# Patient Record
Sex: Female | Born: 1935 | Race: White | Hispanic: No | Smoking: Never smoker
Health system: Southern US, Community
[De-identification: ages and names within clinical notes are randomized; demographics above are authoritative.]

## PROBLEM LIST (undated history)

## (undated) DIAGNOSIS — I4891 Unspecified atrial fibrillation: Secondary | ICD-10-CM

## (undated) DIAGNOSIS — F329 Major depressive disorder, single episode, unspecified: Secondary | ICD-10-CM

## (undated) DIAGNOSIS — I513 Intracardiac thrombosis, not elsewhere classified: Secondary | ICD-10-CM

## (undated) DIAGNOSIS — F32A Depression, unspecified: Secondary | ICD-10-CM

## (undated) DIAGNOSIS — I5022 Chronic systolic (congestive) heart failure: Secondary | ICD-10-CM

## (undated) DIAGNOSIS — I10 Essential (primary) hypertension: Secondary | ICD-10-CM

## (undated) DIAGNOSIS — J40 Bronchitis, not specified as acute or chronic: Secondary | ICD-10-CM

## (undated) DIAGNOSIS — I5042 Chronic combined systolic (congestive) and diastolic (congestive) heart failure: Secondary | ICD-10-CM

## (undated) HISTORY — DX: Major depressive disorder, single episode, unspecified: F32.9

## (undated) HISTORY — DX: Depression, unspecified: F32.A

## (undated) HISTORY — DX: Essential (primary) hypertension: I10

## (undated) HISTORY — PX: SHOULDER SURGERY: SHX246

## (undated) HISTORY — DX: Unspecified atrial fibrillation: I48.91

## (undated) HISTORY — DX: Chronic combined systolic (congestive) and diastolic (congestive) heart failure: I50.42

---

## 2008-02-26 ENCOUNTER — Encounter: Payer: Self-pay | Admitting: Orthopedic Surgery

## 2008-10-02 ENCOUNTER — Ambulatory Visit: Payer: Self-pay | Admitting: Orthopedic Surgery

## 2008-10-02 DIAGNOSIS — M25519 Pain in unspecified shoulder: Secondary | ICD-10-CM | POA: Insufficient documentation

## 2008-10-02 DIAGNOSIS — M7580 Other shoulder lesions, unspecified shoulder: Secondary | ICD-10-CM

## 2008-11-17 ENCOUNTER — Encounter: Payer: Self-pay | Admitting: Orthopedic Surgery

## 2009-06-13 ENCOUNTER — Encounter: Admission: RE | Admit: 2009-06-13 | Discharge: 2009-06-13 | Payer: Self-pay | Admitting: Sports Medicine

## 2010-05-14 ENCOUNTER — Encounter: Payer: Self-pay | Admitting: Orthopedic Surgery

## 2010-09-24 NOTE — Letter (Signed)
Summary: Medical record request [employer]  Medical record request [employer]   Imported By: Cammie Sickle 06/15/2010 19:23:14  _____________________________________________________________________  External Attachment:    Type:   Image     Comment:   External Document

## 2013-02-23 ENCOUNTER — Telehealth: Payer: Self-pay | Admitting: Family Medicine

## 2013-03-07 ENCOUNTER — Other Ambulatory Visit: Payer: Self-pay | Admitting: *Deleted

## 2013-03-07 DIAGNOSIS — I4891 Unspecified atrial fibrillation: Secondary | ICD-10-CM

## 2013-03-08 ENCOUNTER — Encounter: Payer: Self-pay | Admitting: Cardiovascular Disease

## 2013-03-28 ENCOUNTER — Ambulatory Visit (HOSPITAL_COMMUNITY)
Admission: RE | Admit: 2013-03-28 | Discharge: 2013-03-28 | Disposition: A | Discharge status: Home / Self Care | Payer: Medicare Other | Source: Ambulatory Visit | Attending: Cardiovascular Disease | Admitting: Cardiovascular Disease

## 2013-03-28 DIAGNOSIS — I4891 Unspecified atrial fibrillation: Secondary | ICD-10-CM | POA: Insufficient documentation

## 2013-03-28 NOTE — Progress Notes (Signed)
2D Echo Performed 03/28/2013    Jahzier Villalon, RCS  

## 2013-04-18 ENCOUNTER — Other Ambulatory Visit: Payer: Self-pay | Admitting: Cardiovascular Disease

## 2013-04-18 LAB — CBC WITH DIFFERENTIAL/PLATELET
Eosinophils Absolute: 0.2 10*3/uL (ref 0.0–0.7)
Eosinophils Relative: 3 % (ref 0–5)
Hemoglobin: 16.3 g/dL — ABNORMAL HIGH (ref 12.0–15.0)
Lymphocytes Relative: 34 % (ref 12–46)
Lymphs Abs: 2.4 10*3/uL (ref 0.7–4.0)
MCHC: 32.9 g/dL (ref 30.0–36.0)
MCV: 87.9 fL (ref 78.0–100.0)
Neutro Abs: 3.8 10*3/uL (ref 1.7–7.7)
Neutrophils Relative %: 54 % (ref 43–77)
Platelets: 266 10*3/uL (ref 150–400)
RBC: 5.63 MIL/uL — ABNORMAL HIGH (ref 3.87–5.11)
WBC: 7 10*3/uL (ref 4.0–10.5)

## 2013-04-18 LAB — TSH: TSH: 7 u[IU]/mL — ABNORMAL HIGH (ref 0.350–4.500)

## 2013-04-26 ENCOUNTER — Other Ambulatory Visit: Payer: Self-pay | Admitting: Cardiovascular Disease

## 2013-04-26 LAB — T4, FREE: Free T4: 1.37 ng/dL (ref 0.80–1.80)

## 2013-04-26 LAB — TSH: TSH: 7.739 u[IU]/mL — ABNORMAL HIGH (ref 0.350–4.500)

## 2013-05-17 ENCOUNTER — Emergency Department (HOSPITAL_COMMUNITY): Payer: Medicare Other

## 2013-05-17 ENCOUNTER — Encounter (HOSPITAL_COMMUNITY): Payer: Self-pay

## 2013-05-17 ENCOUNTER — Emergency Department (HOSPITAL_COMMUNITY)
Admission: EM | Admit: 2013-05-17 | Discharge: 2013-05-17 | Disposition: A | Discharge status: Home / Self Care | Payer: Medicare Other | Attending: Emergency Medicine | Admitting: Emergency Medicine

## 2013-05-17 DIAGNOSIS — S0083XA Contusion of other part of head, initial encounter: Secondary | ICD-10-CM

## 2013-05-17 DIAGNOSIS — S0003XA Contusion of scalp, initial encounter: Secondary | ICD-10-CM | POA: Insufficient documentation

## 2013-05-17 DIAGNOSIS — E876 Hypokalemia: Secondary | ICD-10-CM | POA: Insufficient documentation

## 2013-05-17 DIAGNOSIS — IMO0002 Reserved for concepts with insufficient information to code with codable children: Secondary | ICD-10-CM | POA: Insufficient documentation

## 2013-05-17 DIAGNOSIS — W1809XA Striking against other object with subsequent fall, initial encounter: Secondary | ICD-10-CM | POA: Insufficient documentation

## 2013-05-17 DIAGNOSIS — S0990XA Unspecified injury of head, initial encounter: Secondary | ICD-10-CM | POA: Insufficient documentation

## 2013-05-17 DIAGNOSIS — I10 Essential (primary) hypertension: Secondary | ICD-10-CM | POA: Insufficient documentation

## 2013-05-17 DIAGNOSIS — E860 Dehydration: Secondary | ICD-10-CM

## 2013-05-17 DIAGNOSIS — T07XXXA Unspecified multiple injuries, initial encounter: Secondary | ICD-10-CM

## 2013-05-17 DIAGNOSIS — Y929 Unspecified place or not applicable: Secondary | ICD-10-CM | POA: Insufficient documentation

## 2013-05-17 DIAGNOSIS — Y939 Activity, unspecified: Secondary | ICD-10-CM | POA: Insufficient documentation

## 2013-05-17 DIAGNOSIS — W19XXXA Unspecified fall, initial encounter: Secondary | ICD-10-CM

## 2013-05-17 DIAGNOSIS — N390 Urinary tract infection, site not specified: Secondary | ICD-10-CM | POA: Insufficient documentation

## 2013-05-17 DIAGNOSIS — I4891 Unspecified atrial fibrillation: Secondary | ICD-10-CM | POA: Insufficient documentation

## 2013-05-17 LAB — URINALYSIS, ROUTINE W REFLEX MICROSCOPIC
Glucose, UA: NEGATIVE mg/dL
Hgb urine dipstick: NEGATIVE
Nitrite: POSITIVE — AB
Protein, ur: NEGATIVE mg/dL
Urobilinogen, UA: 2 mg/dL — ABNORMAL HIGH (ref 0.0–1.0)

## 2013-05-17 LAB — CBC
HCT: 48 % — ABNORMAL HIGH (ref 36.0–46.0)
MCH: 29.6 pg (ref 26.0–34.0)
MCHC: 32.3 g/dL (ref 30.0–36.0)
Platelets: 235 10*3/uL (ref 150–400)
RDW: 16.6 % — ABNORMAL HIGH (ref 11.5–15.5)
WBC: 14.1 10*3/uL — ABNORMAL HIGH (ref 4.0–10.5)

## 2013-05-17 LAB — URINE MICROSCOPIC-ADD ON

## 2013-05-17 LAB — BASIC METABOLIC PANEL
BUN: 24 mg/dL — ABNORMAL HIGH (ref 6–23)
CO2: 32 mEq/L (ref 19–32)
Chloride: 100 mEq/L (ref 96–112)
GFR calc non Af Amer: 45 mL/min — ABNORMAL LOW (ref >90)

## 2013-05-17 MED ORDER — NITROFURANTOIN MONOHYD MACRO 100 MG PO CAPS: 100.0000 mg | ORAL_CAPSULE | Freq: Two times a day (BID) | ORAL | Status: DC

## 2013-05-17 MED ORDER — POTASSIUM CHLORIDE CRYS ER 20 MEQ PO TBCR
40.0000 meq | EXTENDED_RELEASE_TABLET | Freq: Once | ORAL | Status: AC
  Administered 2013-05-17: 40 meq via ORAL
  Filled 2013-05-17: qty 2

## 2013-05-17 MED ORDER — SODIUM CHLORIDE 0.9 % IV BOLUS (SEPSIS)
1000.0000 mL | Freq: Once | INTRAVENOUS | Status: AC
  Administered 2013-05-17: 1000 mL via INTRAVENOUS

## 2013-05-17 MED ORDER — POTASSIUM CHLORIDE CRYS ER 20 MEQ PO TBCR: 20.0000 meq | EXTENDED_RELEASE_TABLET | Freq: Every day | ORAL | Status: DC

## 2013-05-17 NOTE — ED Notes (Signed)
Pt notified that urine sample is needed for UA, pt states that she cant void at this time

## 2013-05-17 NOTE — ED Notes (Signed)
Per EDP pt is to be discharged once liter of NS has infused.

## 2013-05-17 NOTE — ED Notes (Signed)
All abrasions, red areas cleaned with NS, pt tolerated well, small amount of blood noted to be coming from area to forehead. Bleeding controlled, pt denies any LOC, neck or back pain, last tetanus was <5 years ago, pt now states that she thinks that her foot may have become tangled on a rug in her bedroom causing her to fall.

## 2013-05-17 NOTE — ED Provider Notes (Signed)
5:02 PM  Discussed with patient's daughter her multiple diagnoses. They report because patient is well-appearing, no complaints, they would like to take the patient home. I feel like this is a completely reasonable plan. Discussed delayed head bleed return precautions given patient is on anticoagulation. Patient's daughter will be able to stay with the patient today and tonight. Will have him followup with her primary care physician to have her potassium rechecked in the next several days.  Layla Maw Lavette Yankovich, DO 05/17/13 [phone]

## 2013-05-17 NOTE — ED Notes (Signed)
Pt still unable to obtain urine sample,  

## 2013-05-17 NOTE — ED Notes (Signed)
Pt reports woke up this am and legs felt weak when she started walking.  Pt says fell and hit head on a chair.  Pt has laceration to left side of head, swelling, bruise, and laceration to forehead, and abrasion to r knee.  Pt on blood thinner.

## 2013-05-17 NOTE — ED Notes (Signed)
NS still infusing.  Pt is in no distress, eating meal that family brought

## 2013-05-17 NOTE — ED Notes (Addendum)
Pt states that she was bending over to place water for her dog in the floor when she fell hitting her head against a wooden chair, falling to the floor, pt has swelling, laceration noted to left forehead area, ?laceration to left side of head area, abrasion to right knee area, redness noted to bilateral knees area, abrasions noted to bilateral toes, bleeding controlled at present time, pt alert able to answer questions, states that she got up this am and that her legs have been feeling weak today. Recently being treated for bronchitis and sinus infection, last dose of antibiotics is to be taken today.

## 2013-05-17 NOTE — ED Notes (Signed)
Wounds cleaned and band aids applied

## 2013-05-17 NOTE — ED Provider Notes (Signed)
This chart was scribed for Layla Maw Akiba Melfi, DO by Dorothey Baseman, ED Scribe. This patient was seen in room APA10/APA10 and the patient's care was started at 12:56 PM.   TIME SEEN: 12:56 PM  CHIEF COMPLAINT: fall  HPI:  HPI Comments: Laurie Ramirez is a 79 y.o. female  With history of hypertension, atrial fibrillation on amiodarone who presents to the Emergency Department complaining of a fall that occurred this morning when she reports that her extremities felt weak. She reports hitting her head on a chair, but denies loss of consciousness. Patient reports a laceration to the left side of the forehead and head, abrasion to the right knee with associated ecchymosis and swelling. She was able to walk after her fall. She denies chest pain, shortness of breath, dizziness, weakness, numbness, or paresthesias, or focal weakness. Patient is on Xarelto.  Patient reports that her last tetanus vaccination was 4-5 years ago.    ROS: See HPI Constitutional: no fever  Eyes: no drainage  ENT: no runny nose   Cardiovascular:  no chest pain  Resp: no SOB  GI: no vomiting GU: no dysuria Integumentary: no rash  Allergy: no hives  Musculoskeletal: no leg swelling  Neurological: no slurred speech ROS otherwise negative  PAST MEDICAL HISTORY/PAST SURGICAL HISTORY:  Past Medical History  Diagnosis Date  . Hypertension   . Irregular heart rate     MEDICATIONS:  Prior to Admission medications   Not on File    ALLERGIES:  No Known Allergies  SOCIAL HISTORY:  History  Substance Use Topics  . Smoking status: Never Smoker   . Smokeless tobacco: Not on file  . Alcohol Use: No    FAMILY HISTORY: No family history on file.  EXAM:  Triage Vitals: BP 129/42  Pulse 97  Temp(Src) 98.5 F (36.9 C) (Oral)  Resp 20  Ht 5\' 4"  (1.626 m)  Wt 190 lb (86.183 kg)  BMI 32.6 kg/m2  SpO2 94%  CONSTITUTIONAL: Alert and oriented x 3 and responds appropriately to questions. Well-appearing; well-nourished;  GCS 15 HEAD: Normocephalic; large left-sided for head hematoma with associated superficial laceration that is hemostatic EYES: Conjunctivae clear, PERRL, EOMI ENT: normal nose; no rhinorrhea; moist mucous membranes; pharynx without lesions noted; no dental injury; no hemotypanum; no septal hematoma NECK: Supple, no meningismus, no LAD; no midline spinal tenderness, step-off or deformity CARD: RRR; S1 and S2 appreciated; no murmurs, no clicks, no rubs, no gallops RESP: Normal chest excursion without splinting or tachypnea; breath sounds clear and equal bilaterally; no wheezes, no rhonchi, no rales; chest wall stable, nontender to palpation ABD/GI: Normal bowel sounds; non-distended; soft, non-tender, no rebound, no guarding PELVIS:  stable, nontender to palpation BACK:  The back appears normal and is non-tender to palpation, there is no CVA tenderness; no midline spinal tenderness, step-off or deformity, patient has significant kyphosis EXT: Normal ROM in all joints; non-tender to palpation; no edema; normal capillary refill; no cyanosis    SKIN: Normal color for age and race; warm; hematoma to left forehead, 3 cm abrasion to left temporal area, abrasion to the right knee and bilateral dorsal toes NEURO: Moves all extremities equally, sensation to light touch intact diffusely, cranial nerves II through XII intact PSYCH: The patient's mood and manner are appropriate. Grooming and personal hygiene are appropriate.  MEDICAL DECISION MAKING: 12:58PM- Will order a CT of the head and neck, UA, and labs. Discussed treatment plan with patient at bedside and patient verbalized agreement.   4:29 PM  Pt is in her normal state of health and has no current complaints. She is hemodynamically stable. Her labs do show mild dehydration for which I've given 1 L of IV fluids and encouraged her to continue to drink plenty of fluids at home. She is also mildly hypokalemic which we will replace in the ED as well as at  home. Patient also has a urinary tract infection. We'll discharge with prescription for Macrobid. Urine culture pending. Head and cervical spine CTs reviewed and are unremarkable. No acute injury. Discussed with patient and family that I feel she is safe home and followup with her primary care physician. Given strict return precautions. They verbalize understanding and are comfortable with plan.   I personally performed the services described in this documentation, which was scribed in my presence. The recorded information has been reviewed and is accurate.    Layla Maw Gradie Butrick, DO 05/17/13 1630

## 2013-05-19 LAB — URINE CULTURE

## 2013-05-20 ENCOUNTER — Telehealth (HOSPITAL_COMMUNITY): Payer: Self-pay | Admitting: Emergency Medicine

## 2013-05-20 NOTE — ED Notes (Signed)
Post ED Visit - Positive Culture Follow-up  Culture report reviewed by antimicrobial stewardship pharmacist: []  Wes Dulaney, Pharm.D., BCPS [x]  Celedonio Miyamoto, Pharm.D., BCPS []  Georgina Pillion, [address].D., BCPS []  [place], [place].D., BCPS, AAHIVP []  Estella Husk, Pharm.D., BCPS, AAHIVP  Positive urine culture Treated with Macrobid, organism sensitive to the same and no further patient follow-up is required at this time.  [name] 05/20/2013, 1:49 PM

## 2013-06-18 ENCOUNTER — Emergency Department (HOSPITAL_COMMUNITY): Payer: Medicare Other

## 2013-06-18 ENCOUNTER — Encounter (HOSPITAL_COMMUNITY): Payer: Self-pay | Admitting: Emergency Medicine

## 2013-06-18 ENCOUNTER — Inpatient Hospital Stay (HOSPITAL_COMMUNITY)
Admission: EM | Admit: 2013-06-18 | Discharge: 2013-06-21 | DRG: 291 | Disposition: A | Discharge status: Home Health | Payer: Medicare Other | Attending: Internal Medicine | Admitting: Internal Medicine

## 2013-06-18 DIAGNOSIS — Z8249 Family history of ischemic heart disease and other diseases of the circulatory system: Secondary | ICD-10-CM

## 2013-06-18 DIAGNOSIS — I4891 Unspecified atrial fibrillation: Secondary | ICD-10-CM | POA: Diagnosis present

## 2013-06-18 DIAGNOSIS — E876 Hypokalemia: Secondary | ICD-10-CM | POA: Diagnosis present

## 2013-06-18 DIAGNOSIS — I5022 Chronic systolic (congestive) heart failure: Secondary | ICD-10-CM

## 2013-06-18 DIAGNOSIS — E669 Obesity, unspecified: Secondary | ICD-10-CM | POA: Diagnosis present

## 2013-06-18 DIAGNOSIS — I509 Heart failure, unspecified: Secondary | ICD-10-CM | POA: Diagnosis present

## 2013-06-18 DIAGNOSIS — Z6837 Body mass index (BMI) 37.0-37.9, adult: Secondary | ICD-10-CM

## 2013-06-18 DIAGNOSIS — J9600 Acute respiratory failure, unspecified whether with hypoxia or hypercapnia: Secondary | ICD-10-CM | POA: Diagnosis present

## 2013-06-18 DIAGNOSIS — I10 Essential (primary) hypertension: Secondary | ICD-10-CM | POA: Diagnosis present

## 2013-06-18 DIAGNOSIS — I5023 Acute on chronic systolic (congestive) heart failure: Principal | ICD-10-CM | POA: Diagnosis present

## 2013-06-18 HISTORY — DX: Chronic systolic (congestive) heart failure: I50.22

## 2013-06-18 HISTORY — DX: Bronchitis, not specified as acute or chronic: J40

## 2013-06-18 LAB — CBC WITH DIFFERENTIAL/PLATELET
Basophils Absolute: 0 10*3/uL (ref 0.0–0.1)
Basophils Relative: 0 % (ref 0–1)
Eosinophils Relative: 2 % (ref 0–5)
HCT: 47.8 % — ABNORMAL HIGH (ref 36.0–46.0)
Hemoglobin: 15.4 g/dL — ABNORMAL HIGH (ref 12.0–15.0)
Lymphocytes Relative: 30 % (ref 12–46)
MCHC: 32.2 g/dL (ref 30.0–36.0)
MCV: 94.3 fL (ref 78.0–100.0)
Monocytes Absolute: 0.5 10*3/uL (ref 0.1–1.0)
Monocytes Relative: 6 % (ref 3–12)
Neutro Abs: 4.8 10*3/uL (ref 1.7–7.7)
WBC: 7.9 10*3/uL (ref 4.0–10.5)

## 2013-06-18 LAB — COMPREHENSIVE METABOLIC PANEL
Albumin: 3.1 g/dL — ABNORMAL LOW (ref 3.5–5.2)
Alkaline Phosphatase: 61 U/L (ref 39–117)
BUN: 28 mg/dL — ABNORMAL HIGH (ref 6–23)
Chloride: 99 mEq/L (ref 96–112)
GFR calc Af Amer: 53 mL/min — ABNORMAL LOW (ref >90)
Glucose, Bld: 103 mg/dL — ABNORMAL HIGH (ref 70–99)
Potassium: 3.7 mEq/L (ref 3.5–5.1)
Sodium: 137 mEq/L (ref 135–145)
Total Bilirubin: 1.4 mg/dL — ABNORMAL HIGH (ref 0.3–1.2)

## 2013-06-18 LAB — TROPONIN I
Troponin I: <0.3 ng/mL (ref <0.30)
Troponin I: <0.3 ng/mL (ref <0.30)

## 2013-06-18 MED ORDER — INFLUENZA VAC SPLIT QUAD 0.5 ML IM SUSP
0.5000 mL | INTRAMUSCULAR | Status: AC
  Administered 2013-06-19: 0.5 mL via INTRAMUSCULAR
  Filled 2013-06-18: qty 0.5

## 2013-06-18 MED ORDER — HYDROCHLOROTHIAZIDE 12.5 MG PO CAPS
12.5000 mg | ORAL_CAPSULE | Freq: Two times a day (BID) | ORAL | Status: DC
  Administered 2013-06-18 – 2013-06-21 (×6): 12.5 mg via ORAL
  Filled 2013-06-18 (×6): qty 1

## 2013-06-18 MED ORDER — FUROSEMIDE 10 MG/ML IJ SOLN
40.0000 mg | Freq: Once | INTRAMUSCULAR | Status: AC
  Administered 2013-06-18: 40 mg via INTRAVENOUS
  Filled 2013-06-18: qty 4

## 2013-06-18 MED ORDER — ACETAMINOPHEN 325 MG PO TABS
650.0000 mg | ORAL_TABLET | ORAL | Status: DC | PRN
  Administered 2013-06-18: 650 mg via ORAL
  Filled 2013-06-18: qty 2

## 2013-06-18 MED ORDER — LISINOPRIL 10 MG PO TABS
20.0000 mg | ORAL_TABLET | Freq: Two times a day (BID) | ORAL | Status: DC
  Administered 2013-06-18 – 2013-06-21 (×6): 20 mg via ORAL
  Filled 2013-06-18 (×6): qty 2

## 2013-06-18 MED ORDER — AMIODARONE HCL 200 MG PO TABS
200.0000 mg | ORAL_TABLET | Freq: Every day | ORAL | Status: DC
  Administered 2013-06-19 – 2013-06-21 (×3): 200 mg via ORAL
  Filled 2013-06-18 (×3): qty 1

## 2013-06-18 MED ORDER — DULOXETINE HCL 30 MG PO CPEP
30.0000 mg | ORAL_CAPSULE | Freq: Every day | ORAL | Status: DC
  Administered 2013-06-18 – 2013-06-21 (×4): 30 mg via ORAL
  Filled 2013-06-18 (×4): qty 1

## 2013-06-18 MED ORDER — LISINOPRIL-HYDROCHLOROTHIAZIDE 20-12.5 MG PO TABS: 1.0000 | ORAL_TABLET | Freq: Two times a day (BID) | ORAL | Status: DC

## 2013-06-18 MED ORDER — SODIUM CHLORIDE 0.9 % IV SOLN: 250.0000 mL | INTRAVENOUS | Status: DC | PRN

## 2013-06-18 MED ORDER — SODIUM CHLORIDE 0.9 % IJ SOLN
3.0000 mL | Freq: Two times a day (BID) | INTRAMUSCULAR | Status: DC
  Administered 2013-06-18 – 2013-06-21 (×5): 3 mL via INTRAVENOUS

## 2013-06-18 MED ORDER — POTASSIUM CHLORIDE CRYS ER 20 MEQ PO TBCR
40.0000 meq | EXTENDED_RELEASE_TABLET | Freq: Every day | ORAL | Status: DC
  Administered 2013-06-18 – 2013-06-21 (×4): 40 meq via ORAL
  Filled 2013-06-18 (×4): qty 2

## 2013-06-18 MED ORDER — METOPROLOL TARTRATE 50 MG PO TABS
100.0000 mg | ORAL_TABLET | Freq: Two times a day (BID) | ORAL | Status: DC
  Administered 2013-06-18 – 2013-06-21 (×6): 100 mg via ORAL
  Filled 2013-06-18 (×6): qty 2

## 2013-06-18 MED ORDER — ALBUTEROL SULFATE (5 MG/ML) 0.5% IN NEBU
5.0000 mg | INHALATION_SOLUTION | Freq: Once | RESPIRATORY_TRACT | Status: AC
  Administered 2013-06-18: 5 mg via RESPIRATORY_TRACT
  Filled 2013-06-18: qty 1

## 2013-06-18 MED ORDER — IPRATROPIUM BROMIDE 0.02 % IN SOLN
0.5000 mg | Freq: Once | RESPIRATORY_TRACT | Status: AC
  Administered 2013-06-18: 0.5 mg via RESPIRATORY_TRACT
  Filled 2013-06-18: qty 2.5

## 2013-06-18 MED ORDER — METHYLPREDNISOLONE SODIUM SUCC 125 MG IJ SOLR
125.0000 mg | Freq: Once | INTRAMUSCULAR | Status: AC
  Administered 2013-06-18: 125 mg via INTRAVENOUS
  Filled 2013-06-18: qty 2

## 2013-06-18 MED ORDER — SODIUM CHLORIDE 0.9 % IJ SOLN
3.0000 mL | INTRAMUSCULAR | Status: DC | PRN
  Administered 2013-06-20: 3 mL via INTRAVENOUS

## 2013-06-18 MED ORDER — FUROSEMIDE 10 MG/ML IJ SOLN
40.0000 mg | Freq: Two times a day (BID) | INTRAMUSCULAR | Status: DC
  Administered 2013-06-18 – 2013-06-21 (×6): 40 mg via INTRAVENOUS
  Filled 2013-06-18 (×6): qty 4

## 2013-06-18 MED ORDER — ONDANSETRON HCL 4 MG/2ML IJ SOLN: 4.0000 mg | Freq: Four times a day (QID) | INTRAMUSCULAR | Status: DC | PRN

## 2013-06-18 MED ORDER — ASPIRIN EC 81 MG PO TBEC
81.0000 mg | DELAYED_RELEASE_TABLET | Freq: Every day | ORAL | Status: DC
  Administered 2013-06-18 – 2013-06-21 (×4): 81 mg via ORAL
  Filled 2013-06-18 (×4): qty 1

## 2013-06-18 MED ORDER — ENOXAPARIN SODIUM 40 MG/0.4ML ~~LOC~~ SOLN
40.0000 mg | SUBCUTANEOUS | Status: DC
  Administered 2013-06-18 – 2013-06-20 (×3): 40 mg via SUBCUTANEOUS
  Filled 2013-06-18 (×3): qty 0.4

## 2013-06-18 MED ORDER — OLOPATADINE HCL 0.1 % OP SOLN
1.0000 [drp] | Freq: Two times a day (BID) | OPHTHALMIC | Status: DC
  Administered 2013-06-19 – 2013-06-21 (×5): 1 [drp] via OPHTHALMIC
  Filled 2013-06-18: qty 5

## 2013-06-18 NOTE — ED Provider Notes (Signed)
CSN: [id number]     Arrival date & time 06/18/13  0917 History   This chart was scribed for Laurie Lennert, MD by Bennett Scrape, ED Scribe. This patient was seen in room APA07/APA07 and the patient's care was started at 9:37 AM.   Chief Complaint  Patient presents with  . Shortness of Breath    Patient is a 79 y.o. female presenting with shortness of breath. The history is provided by the patient. No language interpreter was used.  Shortness of Breath Severity:  Mild Onset quality:  Gradual Duration:  6 weeks Timing:  Constant Progression:  Worsening Chronicity:  New Context: URI   Relieved by:  Nothing Worsened by:  Exertion Associated symptoms: cough and wheezing   Associated symptoms: no abdominal pain, no chest pain, no headaches and no rash     HPI Comments: Laurie Ramirez is a 79 y.o. female who presents to the Emergency Department complaining of 5 to 6 weeks of SOB with associated wheezing and cough. The SOB increases with exertion and pt reports that the cough was productive of pale sputum up until last week. She denies yellow or green sputum. She states that she was seen by her PCP, diagnosed with bronchitis and has finished two round of antibiotics with no improvement. She denies any fevers. She has a h/o recently diagnosed irregular heart rate within the past two months.   [facility]P is [facility] [place], prior echocardiogram last year. Unsure of the results.   Past Medical History  Diagnosis Date  . Hypertension   . Irregular heart rate   . A-fib   . Bronchitis    Past Surgical History  Procedure Laterality Date  . Shoulder surgery     No family history on file. History  Substance Use Topics  . Smoking status: Never Smoker   . Smokeless tobacco: Not on file  . Alcohol Use: No   No OB history provided.  Review of Systems  Constitutional: Negative for appetite change and fatigue.  HENT: Negative for congestion, ear discharge and sinus pressure.    Eyes: Negative for discharge.  Respiratory: Positive for cough, shortness of breath and wheezing.   Cardiovascular: Negative for chest pain.  Gastrointestinal: Negative for abdominal pain and diarrhea.  Genitourinary: Negative for frequency and hematuria.  Musculoskeletal: Negative for back pain.  Skin: Negative for rash.  Neurological: Negative for seizures and headaches.  Psychiatric/Behavioral: Negative for hallucinations.    Allergies  Review of patient's allergies indicates no known allergies.  Home Medications   Current Outpatient Rx  Name  Route  Sig  Dispense  Refill  . amiodarone (PACERONE) 200 MG tablet   Oral   Take 200 mg by mouth daily.         Marland Kitchen azithromycin (ZITHROMAX) 250 MG tablet   Oral   Take 250-500 mg by mouth daily. Take 500mg  on day 1 and then take 250mg  on days 2-5.         Marland Kitchen diclofenac (VOLTAREN) 75 MG EC tablet   Oral   Take 75 mg by mouth 2 (two) times daily.         . DULoxetine (CYMBALTA) 30 MG capsule   Oral   Take 30 mg by mouth daily.         Marland Kitchen ibuprofen (ADVIL,MOTRIN) 200 MG tablet   Oral   Take 400 mg by mouth every 6 (six) hours as needed for pain.         . metoprolol (  LOPRESSOR) 100 MG tablet   Oral   Take 100 mg by mouth 2 (two) times daily.         . nitrofurantoin, macrocrystal-monohydrate, (MACROBID) 100 MG capsule   Oral   Take 1 capsule (100 mg total) by mouth 2 (two) times daily.   14 capsule   0   . potassium chloride SA (K-DUR,KLOR-CON) 20 MEQ tablet   Oral   Take 1 tablet (20 mEq total) by mouth daily.   6 tablet   0   . Rivaroxaban (XARELTO) 20 MG TABS tablet   Oral   Take 20 mg by mouth daily.          Triage Vitals: BP 143/85  Pulse 89  Temp(Src) 98.3 F (36.8 C) (Oral)  Resp 24  SpO2 96%  Physical Exam  Nursing note and vitals reviewed. Constitutional: She is oriented to person, place, and time. She appears well-developed and well-nourished.  HENT:  Head: Normocephalic and  atraumatic.  Eyes: Conjunctivae and EOM are normal. No scleral icterus.  Neck: Neck supple. No thyromegaly present.  Cardiovascular: Normal rate.  An irregular rhythm present. Exam reveals no gallop and no friction rub.   No murmur heard. Pulmonary/Chest: Effort normal. No stridor. She has wheezes (minimal wheezing bilaterally). She has no rales. She exhibits no tenderness.  Abdominal: She exhibits no distension. There is no tenderness. There is no rebound.  Musculoskeletal: Normal range of motion. She exhibits no edema.  Lymphadenopathy:    She has no cervical adenopathy.  Neurological: She is oriented to person, place, and time. She exhibits normal muscle tone. Coordination normal.  Skin: No rash noted. No erythema.  Psychiatric: She has a normal mood and affect. Her behavior is normal.    ED Course  Procedures (including critical care time)  Medications  ipratropium (ATROVENT) nebulizer solution 0.5 mg (not administered)  albuterol (PROVENTIL) (5 MG/ML) 0.5% nebulizer solution 5 mg (not administered)  methylPREDNISolone sodium succinate (SOLU-MEDROL) 125 mg/2 mL injection 125 mg (not administered)    DIAGNOSTIC STUDIES: Oxygen Saturation is 96% on room air, normal by my interpretation.    COORDINATION OF CARE: 9:39 AM-Discussed treatment plan which includes breathing treatment, CXR, CBC panel, CMP and troponin with pt at bedside and pt agreed to plan.   12:02 PM- Pt rechecked and wheezing has improved. Informed pt of CXR results showing fluid. Discussed admission with pt and pt is agreeable.  12:10 PM-Consult complete with Dr. Kerry Hough, Hospitalist. Patient case explained and discussed. Dr. Kerry Hough agrees to admit patient for further evaluation and treatment. Call ended at 12:11 PM  Labs Review Labs Reviewed  CBC WITH DIFFERENTIAL - Abnormal; Notable for the following:    Hemoglobin 15.4 (*)    HCT 47.8 (*)    RDW 16.0 (*)    All other components within normal limits  PRO B  NATRIURETIC PEPTIDE - Abnormal; Notable for the following:    Pro B Natriuretic peptide (BNP) 11470.0 (*)    All other components within normal limits  COMPREHENSIVE METABOLIC PANEL - Abnormal; Notable for the following:    Glucose, Bld 103 (*)    BUN 28 (*)    Creatinine, Ser 1.11 (*)    Albumin 3.1 (*)    Total Bilirubin 1.4 (*)    GFR calc non Af Amer 46 (*)    GFR calc Af Amer 53 (*)    All other components within normal limits  TROPONIN I   Imaging Review Dg Chest Portable 1 View  06/18/2013   CLINICAL DATA:  Shortness of breath  EXAM: PORTABLE CHEST - 1 VIEW  COMPARISON:  None.  FINDINGS: Mild to moderate cardiac enlargement. Mild vascular congestion. No evidence of pulmonary edema or pleural effusion. Significant glenohumeral degenerative change bilaterally.  IMPRESSION: Cardiomegaly with vascular congestion but no frank pulmonary edema.   Electronically Signed   By: Esperanza Heir M.D.   On: 06/18/2013 09:58    EKG Interpretation     Ventricular Rate:  96 PR Interval:    QRS Duration: 140 QT Interval:  358 QTC Calculation: 452 R Axis:   42 Text Interpretation:  Atrial fibrillation Right bundle branch block Abnormal ECG No previous ECGs available            MDM  No diagnosis found.   The chart was scribed for me under my direct supervision.  I personally performed the history, physical, and medical decision making and all procedures in the evaluation of this patient.Laurie Lennert, MD 06/18/13 [phone]

## 2013-06-18 NOTE — H&P (Signed)
[facility] History and Physical  Laurie Ramirez:[id number] DOB: May 22, 1932 DOA: 06/18/2013  Referring physician: Dr. Estell Harpin PCP: Colette Ribas, MD  Specialists:   Chief Complaint: shortness of breath  HPI: Laurie Ramirez is a 79 y.o. female who presents to the emergency room with progressive shortness of breath. She is having dyspnea for the past several weeks which is progressively gotten worse. She describes PND and orthopnea. She's also noticed increased ankle edema as well as a weight gain of 30 pounds in the past month. She has seen her primary care physician and has received 2 courses of antibiotics for possible upper respiratory tract infections. She has been coughing with yellow-colored sputum. She has not been febrile. She denies any chest pain or palpitations. She was evaluated in the emergency room where chest x-ray indicated congestive heart failure. Her BNP was markedly elevated. She recently had had an echocardiogram which showed ejection fraction of 45-50%. The patient will be admitted to the hospital for further treatment of congestive heart failure.  Review of Systems: Pertinent positives as per history of present illness, otherwise negative  Past Medical History  Diagnosis Date  . Hypertension   . Irregular heart rate   . A-fib   . Bronchitis    Past Surgical History  Procedure Laterality Date  . Shoulder surgery     Social History:  reports that she has never smoked. She does not have any smokeless tobacco history on file. She reports that she does not drink alcohol or use illicit drugs.   No Known Allergies  Family history:  Mothers side has coronary artery disease  Prior to Admission medications   Medication Sig Start Date End Date Taking? Authorizing Provider  amiodarone (PACERONE) 200 MG tablet Take 200 mg by mouth daily.   Yes Historical Provider, MD  aspirin EC 81 MG tablet Take 81 mg by mouth daily.   Yes Historical Provider, MD   diclofenac (VOLTAREN) 75 MG EC tablet Take 75 mg by mouth daily.    Yes Historical Provider, MD  DULoxetine (CYMBALTA) 30 MG capsule Take 30 mg by mouth daily.   Yes Historical Provider, MD  ibuprofen (ADVIL,MOTRIN) 200 MG tablet Take 400 mg by mouth every 6 (six) hours as needed for pain.   Yes Historical Provider, MD  lisinopril-hydrochlorothiazide (PRINZIDE,ZESTORETIC) 20-12.5 MG per tablet Take 1 tablet by mouth 2 (two) times daily.   Yes Historical Provider, MD  metoprolol (LOPRESSOR) 100 MG tablet Take 100 mg by mouth 2 (two) times daily.   Yes Historical Provider, MD  Olopatadine HCl (PATADAY) 0.2 % SOLN Apply 1 drop to eye daily as needed (allergies).    Historical Provider, MD   Physical Exam: Filed Vitals:   06/18/13 1304  BP: 144/89  Pulse: 102  Temp:   Resp:      General:  NAD  Eyes: Are equal, round, reactive to light  ENT: Mucous members are moist  Neck: Supple  Cardiovascular: S1, S2, irregular  Respiratory: Crackles at bases bilaterally  Abdomen: Soft, obese, positive bowel sounds  Skin: No rashes  Musculoskeletal: One to 2+ pitting edema bilaterally  Psychiatric: Normal affect, cooperative with exam  Neurologic: Grossly intact, nonfocal  Labs on Admission:  Basic Metabolic Panel:  Recent Labs Lab 06/18/13 1015  NA 137  K 3.7  CL 99  CO2 31  GLUCOSE 103*  BUN 28*  CREATININE 1.11*  CALCIUM 9.5   Liver Function Tests:  Recent Labs Lab 06/18/13 1015  AST 18  ALT 10  ALKPHOS 61  BILITOT 1.4*  PROT 6.4  ALBUMIN 3.1*   No results found for this basename: LIPASE, AMYLASE,  in the last 168 hours No results found for this basename: AMMONIA,  in the last 168 hours CBC:  Recent Labs Lab 06/18/13 0953  WBC 7.9  NEUTROABS 4.8  HGB 15.4*  HCT 47.8*  MCV 94.3  PLT 210   Cardiac Enzymes:  Recent Labs Lab 06/18/13 1015  TROPONINI <0.30    BNP (last 3 results)  Recent Labs  06/18/13 1015  PROBNP 11470.0*   CBG: No  results found for this basename: GLUCAP,  in the last 168 hours  Radiological Exams on Admission: Dg Chest Portable 1 View  06/18/2013   CLINICAL DATA:  Shortness of breath  EXAM: PORTABLE CHEST - 1 VIEW  COMPARISON:  None.  FINDINGS: Mild to moderate cardiac enlargement. Mild vascular congestion. No evidence of pulmonary edema or pleural effusion. Significant glenohumeral degenerative change bilaterally.  IMPRESSION: Cardiomegaly with vascular congestion but no frank pulmonary edema.   Electronically Signed   By: Esperanza Heir M.D.   On: 06/18/2013 09:58    EKG: Independently reviewed. Atrial fib  Assessment/Plan Active Problems:   Acute respiratory failure   Acute on chronic systolic CHF (congestive heart failure)   Atrial fibrillation   1. Acute on chronic systolic congestive heart failure. Patient does have some systolic dysfunction per echo recently done in 8/14. She is already on an ACE inhibitor as well as beta blocker. We will continue her on aspirin. She'll be placed on twice a day Lasix with strict I.'s and O.'s. Patient wishes to followup with our cardiology for further care of her heart failure. 2. Acute respiratory failure. Patient is on supplemental oxygen due to volume overload. This will be weaned down as tolerated. 3. Chronic atrial fibrillation. Patient is on rate control with metoprolol. She was on anticoagulation the past but did not tolerate this due to severe bruising. She is currently only on aspirin.   Code Status: full code Family Communication: discussed with patient and family at the bedside Disposition Plan: discharge home once improved  Time spent:  MEMON,JEHANZEB [facility] Pager [phone]  If 7PM-7AM, please contact night-coverage [email or web address] Password [facility] 06/18/2013, 5:31 PM

## 2013-06-18 NOTE — ED Notes (Signed)
Pt presents to er with c/o sob, wheezing, states that she has been diagnosed with bronchitis 5 weeks ago, has been placed on two different antibiotics, was having a cough but states that over this last week she is not able to produce any sputum with the cough, sob increases with exertion, audible wheezing noted on pt arrival to tx room,

## 2013-06-19 LAB — TSH: TSH: 3.276 u[IU]/mL (ref 0.350–4.500)

## 2013-06-19 LAB — BASIC METABOLIC PANEL
BUN: 29 mg/dL — ABNORMAL HIGH (ref 6–23)
CO2: 34 mEq/L — ABNORMAL HIGH (ref 19–32)
Chloride: 96 mEq/L (ref 96–112)
Creatinine, Ser: 1.15 mg/dL — ABNORMAL HIGH (ref 0.50–1.10)
GFR calc Af Amer: 51 mL/min — ABNORMAL LOW (ref >90)
GFR calc non Af Amer: 44 mL/min — ABNORMAL LOW (ref >90)
Potassium: 3.7 mEq/L (ref 3.5–5.1)

## 2013-06-19 LAB — TROPONIN I: Troponin I: <0.3 ng/mL (ref <0.30)

## 2013-06-19 NOTE — Progress Notes (Signed)
[facility] PROGRESS NOTE  Laurie CHAUDHARI ZOX:[id number] DOB: 24-Dec-1931 DOA: 06/18/2013 PCP: Colette Ribas, MD  Assessment/Plan: 1. Acute respiratory failure due to CHF. Continue to wean down oxygen as tolerated. 2. Acute on chronic systolic congestive heart failure. We'll request echocardiogram from [facility] heart and vascular. Continue ACE inhibitor and beta blocker. Continue on aspirin. Continue IV Lasix as patient is clinically improving. 3. Chronic atrial fibrillation. Rate control metoprolol. She has not tolerated anticoagulation the past due to severe bruising. Continue aspirin.  Code Status: full code Family Communication: discussed with patient and daughter Disposition Plan: discharge home once improved   Consultants:  none  Procedures:  none  Antibiotics:  none  HPI/Subjective: Feeling better, feels breathing is improving  Objective: Filed Vitals:   06/19/13 1510  BP: 158/86  Pulse: 102  Temp: 97.8 F (36.6 C)  Resp: 24    Intake/Output Summary (Last 24 hours) at 06/19/13 1731 Last data filed at 06/19/13 1400  Gross per 24 hour  Intake    200 ml  Output   1650 ml  Net  -1450 ml   Filed Weights   06/18/13 1304 06/19/13 0500  Weight: 105.4 kg (232 lb 5.8 oz) 102.8 kg (226 lb 10.1 oz)    Exam:   General:  NAD  Cardiovascular: S1, s2 irregular  Respiratory: crackles at bases, improving  Abdomen: soft, nt, nd, bs+  Musculoskeletal: 1+ edema b/l   Data Reviewed: Basic Metabolic Panel:  Recent Labs Lab 06/18/13 1015 06/19/13 0543  NA 137 140  K 3.7 3.7  CL 99 96  CO2 31 34*  GLUCOSE 103* 137*  BUN 28* 29*  CREATININE 1.11* 1.15*  CALCIUM 9.5 9.7   Liver Function Tests:  Recent Labs Lab 06/18/13 1015  AST 18  ALT 10  ALKPHOS 61  BILITOT 1.4*  PROT 6.4  ALBUMIN 3.1*   No results found for this basename: LIPASE, AMYLASE,  in the last 168 hours No results found for this basename: AMMONIA,  in the last 168  hours CBC:  Recent Labs Lab 06/18/13 0953  WBC 7.9  NEUTROABS 4.8  HGB 15.4*  HCT 47.8*  MCV 94.3  PLT 210   Cardiac Enzymes:  Recent Labs Lab 06/18/13 1015 06/18/13 1736 06/18/13 2332 06/19/13 0543  TROPONINI <0.30 <0.30 <0.30 <0.30   BNP (last 3 results)  Recent Labs  06/18/13 1015  PROBNP 11470.0*   CBG: No results found for this basename: GLUCAP,  in the last 168 hours  No results found for this or any previous visit (from the past 240 hour(s)).   Studies: Dg Chest Portable 1 View  06/18/2013   CLINICAL DATA:  Shortness of breath  EXAM: PORTABLE CHEST - 1 VIEW  COMPARISON:  None.  FINDINGS: Mild to moderate cardiac enlargement. Mild vascular congestion. No evidence of pulmonary edema or pleural effusion. Significant glenohumeral degenerative change bilaterally.  IMPRESSION: Cardiomegaly with vascular congestion but no frank pulmonary edema.   Electronically Signed   By: Esperanza Heir M.D.   On: 06/18/2013 09:58    Scheduled Meds: . amiodarone  200 mg Oral Daily  . aspirin EC  81 mg Oral Daily  . DULoxetine  30 mg Oral Daily  . enoxaparin (LOVENOX) injection  40 mg Subcutaneous Q24H  . furosemide  40 mg Intravenous BID  . hydrochlorothiazide  12.5 mg Oral BID  . lisinopril  20 mg Oral BID  . metoprolol  100 mg Oral BID  . olopatadine  1 drop Both  Eyes BID  . potassium chloride  40 mEq Oral Daily  . sodium chloride  3 mL Intravenous Q12H   Continuous Infusions:   Active Problems:   Acute respiratory failure   Acute on chronic systolic CHF (congestive heart failure)   Atrial fibrillation    Time spent:    Modine Oppenheimer  [facility] Pager [phone]. If 7PM-7AM, please contact night-coverage at [email or web address], password [facility] 06/19/2013, 5:31 PM  LOS: 1 day

## 2013-06-19 NOTE — Progress Notes (Signed)
Patient given Heart Failure Education packet.  Educated patient again on daily weights and how to use weight chart.  Verbalizes understanding.  Also spoke to patient about diet and low sodium foods.  Patient verbalizes correct alternatives to sodium rich foods.  Family also at bedside participating in education.  No questions at this time.  Will continue to educate throughout admission.

## 2013-06-20 LAB — MAGNESIUM: Magnesium: 1.4 mg/dL — ABNORMAL LOW (ref 1.5–2.5)

## 2013-06-20 LAB — BASIC METABOLIC PANEL
BUN: 32 mg/dL — ABNORMAL HIGH (ref 6–23)
CO2: 38 mEq/L — ABNORMAL HIGH (ref 19–32)
Calcium: 9.7 mg/dL (ref 8.4–10.5)
Chloride: 94 mEq/L — ABNORMAL LOW (ref 96–112)
Creatinine, Ser: 1.17 mg/dL — ABNORMAL HIGH (ref 0.50–1.10)
GFR calc non Af Amer: 43 mL/min — ABNORMAL LOW (ref >90)
Potassium: 2.7 mEq/L — CL (ref 3.5–5.1)
Sodium: 142 mEq/L (ref 135–145)

## 2013-06-20 LAB — CBC
HCT: 48.6 % — ABNORMAL HIGH (ref 36.0–46.0)
Hemoglobin: 15.7 g/dL — ABNORMAL HIGH (ref 12.0–15.0)
MCHC: 32.3 g/dL (ref 30.0–36.0)
MCV: 95.1 fL (ref 78.0–100.0)
Platelets: 214 10*3/uL (ref 150–400)
RDW: 15.7 % — ABNORMAL HIGH (ref 11.5–15.5)
WBC: 10.1 10*3/uL (ref 4.0–10.5)

## 2013-06-20 LAB — PROTIME-INR: Prothrombin Time: 13.7 seconds (ref 11.6–15.2)

## 2013-06-20 LAB — GLUCOSE, CAPILLARY

## 2013-06-20 MED ORDER — POTASSIUM CHLORIDE 10 MEQ/100ML IV SOLN
10.0000 meq | INTRAVENOUS | Status: AC
  Administered 2013-06-20 (×3): 10 meq via INTRAVENOUS
  Filled 2013-06-20 (×3): qty 100

## 2013-06-20 MED ORDER — POLYETHYLENE GLYCOL 3350 17 G PO PACK
17.0000 g | PACK | Freq: Every day | ORAL | Status: DC
  Administered 2013-06-20 – 2013-06-21 (×2): 17 g via ORAL
  Filled 2013-06-20 (×2): qty 1

## 2013-06-20 MED ORDER — POTASSIUM CHLORIDE CRYS ER 20 MEQ PO TBCR
40.0000 meq | EXTENDED_RELEASE_TABLET | Freq: Two times a day (BID) | ORAL | Status: AC
  Administered 2013-06-20: 40 meq via ORAL
  Filled 2013-06-20: qty 2

## 2013-06-20 NOTE — Progress Notes (Signed)
Brought patient Advance Directives information and she stated she did not want it.

## 2013-06-20 NOTE — Progress Notes (Signed)
UR Chart Review Completed  

## 2013-06-20 NOTE — Progress Notes (Signed)
Critical Potassium of 2.7. MD aware. Order for three potassium runs.

## 2013-06-20 NOTE — Progress Notes (Signed)
[facility] PROGRESS NOTE  Laurie Ramirez ZOX:[id number] DOB: [birth year] DOA: 06/18/2013 PCP: Colette Ribas, MD  Assessment/Plan: 1. Acute respiratory failure due to CHF. Patient is currently on room air. We'll ambulate patient and check oxygen saturation 2. Acute on chronic systolic congestive heart failure. Echocardiogram done in 8/14 shows ejection fraction of 45-50%.. Continue ACE inhibitor and beta blocker. Continue on aspirin. Patient continues to have evidence of volume overload. We'll continue IV Lasix for another day 3. Chronic atrial fibrillation. Rate control metoprolol. She has not tolerated anticoagulation the past due to severe bruising. Continue aspirin. 4. Hypokalemia. Replace. Due to Lasix  Code Status: full code Family Communication: discussed with patient and daughter Disposition Plan: discharge home once improved   Consultants:  none  Procedures:  none  Antibiotics:  none  HPI/Subjective: Continued improvement. Feels breathing is improving. Good urine output. It does not appear that recorded intake and output reflects urine output accurately.  Objective: Filed Vitals:   06/20/13 0445  BP: 144/74  Pulse: 76  Temp: 97.7 F (36.5 C)  Resp: 22    Intake/Output Summary (Last 24 hours) at 06/20/13 1411 Last data filed at 06/20/13 1100  Gross per 24 hour  Intake    480 ml  Output    400 ml  Net     80 ml   Filed Weights   06/18/13 1304 06/19/13 0500 06/20/13 0445  Weight: 105.4 kg (232 lb 5.8 oz) 102.8 kg (226 lb 10.1 oz) 100.9 kg (222 lb 7.1 oz)    Exam:   General:  NAD  Cardiovascular: S1, s2 irregular  Respiratory: CTA B  Abdomen: soft, nt, nd, bs+  Musculoskeletal: 1+ edema b/l   Data Reviewed: Basic Metabolic Panel:  Recent Labs Lab 06/18/13 1015 06/19/13 0543 06/20/13 0440  NA 137 140 142  K 3.7 3.7 2.7*  CL 99 96 94*  CO2 31 34* 38*  GLUCOSE 103* 137* 116*  BUN 28* 29* 32*  CREATININE 1.11* 1.15* 1.17*   CALCIUM 9.5 9.7 9.7   Liver Function Tests:  Recent Labs Lab 06/18/13 1015  AST 18  ALT 10  ALKPHOS 61  BILITOT 1.4*  PROT 6.4  ALBUMIN 3.1*   No results found for this basename: LIPASE, AMYLASE,  in the last 168 hours No results found for this basename: AMMONIA,  in the last 168 hours CBC:  Recent Labs Lab 06/18/13 0953 06/20/13 0440  WBC 7.9 10.1  NEUTROABS 4.8  --   HGB 15.4* 15.7*  HCT 47.8* 48.6*  MCV 94.3 95.1  PLT 210 214   Cardiac Enzymes:  Recent Labs Lab 06/18/13 1015 06/18/13 1736 06/18/13 2332 06/19/13 0543  TROPONINI <0.30 <0.30 <0.30 <0.30   BNP (last 3 results)  Recent Labs  06/18/13 1015  PROBNP 11470.0*   CBG:  Recent Labs Lab 06/20/13 0749  GLUCAP 102*    No results found for this or any previous visit (from the past 240 hour(s)).   Studies: No results found.  Scheduled Meds: . amiodarone  200 mg Oral Daily  . aspirin EC  81 mg Oral Daily  . DULoxetine  30 mg Oral Daily  . enoxaparin (LOVENOX) injection  40 mg Subcutaneous Q24H  . furosemide  40 mg Intravenous BID  . hydrochlorothiazide  12.5 mg Oral BID  . lisinopril  20 mg Oral BID  . metoprolol  100 mg Oral BID  . olopatadine  1 drop Both Eyes BID  . potassium chloride  40 mEq Oral Daily  .  potassium chloride  40 mEq Oral BID  . sodium chloride  3 mL Intravenous Q12H   Continuous Infusions:   Active Problems:   Acute respiratory failure   Acute on chronic systolic CHF (congestive heart failure)   Atrial fibrillation    Time spent:    Jiah Bari  [facility] Pager [phone]. If 7PM-7AM, please contact night-coverage at [email or web address], password [facility] 06/20/2013, 2:11 PM  LOS: 2 days

## 2013-06-21 LAB — BASIC METABOLIC PANEL
Calcium: 9.4 mg/dL (ref 8.4–10.5)
Creatinine, Ser: 1.16 mg/dL — ABNORMAL HIGH (ref 0.50–1.10)
GFR calc non Af Amer: 44 mL/min — ABNORMAL LOW (ref >90)
Glucose, Bld: 140 mg/dL — ABNORMAL HIGH (ref 70–99)
Potassium: 3.1 mEq/L — ABNORMAL LOW (ref 3.5–5.1)
Sodium: 137 mEq/L (ref 135–145)

## 2013-06-21 MED ORDER — POTASSIUM CHLORIDE CRYS ER 20 MEQ PO TBCR: 40.0000 meq | EXTENDED_RELEASE_TABLET | Freq: Every day | ORAL | Status: DC

## 2013-06-21 MED ORDER — POTASSIUM CHLORIDE CRYS ER 20 MEQ PO TBCR
40.0000 meq | EXTENDED_RELEASE_TABLET | Freq: Once | ORAL | Status: AC
  Administered 2013-06-21: 40 meq via ORAL
  Filled 2013-06-21: qty 2

## 2013-06-21 MED ORDER — FUROSEMIDE 40 MG PO TABS: ORAL_TABLET | ORAL | Status: DC

## 2013-06-21 NOTE — Progress Notes (Signed)
Patient received discharge instructions along with follow up appointments and prescriptions. Patient had home health arranged before leaving. Patient was escorted by staff via wheelchair to vehicle. Patient discharged to home in stable condition.

## 2013-06-21 NOTE — Care Management Note (Addendum)
    Page 1 of 1   06/21/2013     4:17:16 PM   CARE MANAGEMENT NOTE 06/21/2013  Patient:  Laurie Ramirez, Laurie Ramirez   Account Number:  [id number]  Date Initiated:  06/21/2013  Documentation initiated by:  Rosemary Holms  Subjective/Objective Assessment:   Pt from home where she lives with her adult child.Active with [facility] RN. Will DC home and resume HH RN     Action/Plan:   Anticipated DC Date:  06/21/2013   Anticipated DC Plan:  HOME W HOME HEALTH SERVICES      DC Planning Services  CM consult      Choice offered to / List presented to:     DME arranged  HOSPITAL BED  [facility]      DME agency  [facility]     [facility] arranged  HH-1 RN  HH-10 DISEASE MANAGEMENT      HH agency  [facility]   Status of service:  Completed, signed off Medicare Important Message given?   (If response is "NO", the following Medicare IM given date fields will be blank) Date Medicare IM given:   Date Additional Medicare IM given:    Discharge Disposition:  HOME W HOME HEALTH SERVICES  Per UR Regulation:    If discussed at Long Length of Stay Meetings, dates discussed:    Comments:  06/21/13 Rosemary Holms RN BSN CM [facility] aware of DC and to resume [facility] RN

## 2013-06-21 NOTE — Progress Notes (Signed)
Patient ambulated in hallway and 02 saturation was 92% without 02

## 2013-06-21 NOTE — Discharge Summary (Signed)
Physician Discharge Summary  Laurie Ramirez ZOX:[id number] DOB: 1932-05-12 DOA: 06/18/2013  PCP: Colette Ribas, MD  Admit date: 06/18/2013 Discharge date: 06/21/2013  Time spent: 45 minutes  Recommendations for Outpatient Follow-up:  1. Follow up with primary care physician in 2 weeks 2. Follow up with cardiology as scheduled  Discharge Diagnoses:  Active Problems:   Acute respiratory failure   Acute on chronic systolic CHF (congestive heart failure)   Atrial fibrillation   Discharge Condition: improved  Diet recommendation: low salt  Filed Weights   06/19/13 0500 06/20/13 0445 06/21/13 0451  Weight: 102.8 kg (226 lb 10.1 oz) 100.9 kg (222 lb 7.1 oz) 99.2 kg (218 lb 11.1 oz)    History of present illness:  Laurie Ramirez is a 79 y.o. female who presents to the emergency room with progressive shortness of breath. She is having dyspnea for the past several weeks which is progressively gotten worse. She describes PND and orthopnea. She's also noticed increased ankle edema as well as a weight gain of 30 pounds in the past month. She has seen her primary care physician and has received 2 courses of antibiotics for possible upper respiratory tract infections. She has been coughing with yellow-colored sputum. She has not been febrile. She denies any chest pain or palpitations. She was evaluated in the emergency room where chest x-ray indicated congestive heart failure. Her BNP was markedly elevated. She recently had had an echocardiogram which showed ejection fraction of 45-50%. The patient will be admitted to the hospital for further treatment of congestive heart failure.   Hospital Course:  Patient was admitted to the hospital and started on intravenous Lasix. She's had good diuresis and her weight is down approximately 8 pounds since admission. The patient is feeling significantly improved. She recently had an echocardiogram for ejection fraction was in the 45-50%. She is already  on a beta blocker and ACE inhibitor. She was educated on the importance of low salt intake. She is now breathing comfortably on room air. She was ambulated without any difficulty. Patient is requesting discharge home. She does appear clinically improve. She was previously followed at [facility] heart and vascular, but wishes to followup with the [facility] cardiology in [place] since she finds it difficult to travel to [place]. She's been set up with home health services. She'll be continued on Lasix as an outpatient. She remains in atrial fibrillation. She's not been on any anticoagulation due to severe bruising. This can be readdressed in the outpatient setting.  Procedures:  none  Consultations:  none  Discharge Exam: Filed Vitals:   06/21/13 1424  BP: 126/81  Pulse: 136  Temp: 98.2 F (36.8 C)  Resp: 20    General: NAD Cardiovascular: S1, S2 irregular Respiratory: CTA B  Discharge Instructions      Discharge Orders   Future Appointments Provider Department Dept Phone   07/08/2013 1:40 PM Jonelle Sidle, MD [facility] [phone]   Future Orders Complete By Expires   (HEART FAILURE PATIENTS) Call MD:  Anytime you have any of the following symptoms: 1) 3 pound weight gain in 24 hours or 5 pounds in 1 week 2) shortness of breath, with or without a dry hacking cough 3) swelling in the hands, feet or stomach 4) if you have to sleep on extra pillows at night in order to breathe.  As directed    Diet - low sodium heart healthy  As directed    Face-to-face encounter (required for Medicare/Medicaid patients)  As directed    Comments:     I Clytee Heinrich certify that this patient is under my care and that I, or a nurse practitioner or physician's assistant working with me, had a face-to-face encounter that meets the physician face-to-face encounter requirements with this patient on 06/21/2013. The encounter with the patient was in whole, or in part for the  following medical condition(s) which is the primary reason for home health care (List medical condition): chf exacerbation, would benefit from home health RN for disease management   Questions:     The encounter with the patient was in whole, or in part, for the following medical condition, which is the primary reason for home health care:  chf   I certify that, based on my findings, the following services are medically necessary home health services:  Nursing   My clinical findings support the need for the above services:  Shortness of breath with activity   Further, I certify that my clinical findings support that this patient is homebound due to:  Shortness of Breath with activity   Reason for Medically Necessary Home Health Services:  Skilled Nursing- Teaching of Disease Process/Symptom Management   Home Health  As directed    Questions:     To provide the following care/treatments:  RN   Increase activity slowly  As directed        Medication List    STOP taking these medications       diclofenac 75 MG EC tablet  Commonly known as:  VOLTAREN     ibuprofen 200 MG tablet  Commonly known as:  ADVIL,MOTRIN      TAKE these medications       amiodarone 200 MG tablet  Commonly known as:  PACERONE  Take 200 mg by mouth daily.     aspirin EC 81 MG tablet  Take 81 mg by mouth daily.     DULoxetine 30 MG capsule  Commonly known as:  CYMBALTA  Take 30 mg by mouth daily.     furosemide 40 MG tablet  Commonly known as:  LASIX  Take 1 tab po bid for 5 days then take one tab po daily     lisinopril-hydrochlorothiazide 20-12.5 MG per tablet  Commonly known as:  PRINZIDE,ZESTORETIC  Take 1 tablet by mouth 2 (two) times daily.     metoprolol 100 MG tablet  Commonly known as:  LOPRESSOR  Take 100 mg by mouth 2 (two) times daily.     PATADAY 0.2 % Soln  Generic drug:  Olopatadine HCl  Apply 1 drop to eye daily as needed (allergies).     potassium chloride SA 20 MEQ tablet   Commonly known as:  K-DUR,KLOR-CON  Take 2 tablets (40 mEq total) by mouth daily.       No Known Allergies Follow-up Information   Follow up with Colette Ribas, MD. Schedule an appointment as soon as possible for a visit in 2 weeks.   Specialty:  Family Medicine   Contact information:   [address] [place] [address] [phone]       Follow up with Nona Dell, MD On 07/08/2013. (1:40pm, cardiology)    Specialty:  Cardiology   Contact information:   [address] [place] [address] [phone]        The results of significant diagnostics from this hospitalization (including imaging, microbiology, ancillary and laboratory) are listed below for reference.    Significant Diagnostic Studies:  Dg Chest Portable 1 View  06/18/2013   CLINICAL DATA:  Shortness of breath  EXAM: PORTABLE CHEST - 1 VIEW  COMPARISON:  None.  FINDINGS: Mild to moderate cardiac enlargement. Mild vascular congestion. No evidence of pulmonary edema or pleural effusion. Significant glenohumeral degenerative change bilaterally.  IMPRESSION: Cardiomegaly with vascular congestion but no frank pulmonary edema.   Electronically Signed   By: Esperanza Heir M.D.   On: 06/18/2013 09:58    Microbiology: No results found for this or any previous visit (from the past 240 hour(s)).   Labs: Basic Metabolic Panel:  Recent Labs Lab 06/18/13 1015 06/19/13 0543 06/20/13 0440 06/20/13 1525 06/21/13 0537  NA 137 140 142  --  137  K 3.7 3.7 2.7*  --  3.1*  CL 99 96 94*  --  90*  CO2 31 34* 38*  --  36*  GLUCOSE 103* 137* 116*  --  140*  BUN 28* 29* 32*  --  28*  CREATININE 1.11* 1.15* 1.17*  --  1.16*  CALCIUM 9.5 9.7 9.7  --  9.4  MG  --   --   --  1.4*  --    Liver Function Tests:  Recent Labs Lab 06/18/13 1015  AST 18  ALT 10  ALKPHOS 61  BILITOT 1.4*  PROT 6.4  ALBUMIN 3.1*   No results found for this basename: LIPASE, AMYLASE,  in the last 168  hours No results found for this basename: AMMONIA,  in the last 168 hours CBC:  Recent Labs Lab 06/18/13 0953 06/20/13 0440  WBC 7.9 10.1  NEUTROABS 4.8  --   HGB 15.4* 15.7*  HCT 47.8* 48.6*  MCV 94.3 95.1  PLT 210 214   Cardiac Enzymes:  Recent Labs Lab 06/18/13 1015 06/18/13 1736 06/18/13 2332 06/19/13 0543  TROPONINI <0.30 <0.30 <0.30 <0.30   BNP: BNP (last 3 results)  Recent Labs  06/18/13 1015  PROBNP 11470.0*   CBG:  Recent Labs Lab 06/20/13 0749  GLUCAP 102*       Signed:  Meleane Selinger  [facility] 06/21/2013, 4:51 PM

## 2013-06-27 ENCOUNTER — Encounter (HOSPITAL_COMMUNITY): Payer: Self-pay | Admitting: Emergency Medicine

## 2013-06-27 ENCOUNTER — Other Ambulatory Visit: Payer: Self-pay

## 2013-06-27 ENCOUNTER — Emergency Department (HOSPITAL_COMMUNITY): Payer: Medicare Other

## 2013-06-27 ENCOUNTER — Emergency Department (HOSPITAL_COMMUNITY)
Admission: EM | Admit: 2013-06-27 | Discharge: 2013-06-27 | Disposition: A | Discharge status: Home / Self Care | Payer: Medicare Other | Attending: Emergency Medicine | Admitting: Emergency Medicine

## 2013-06-27 DIAGNOSIS — R112 Nausea with vomiting, unspecified: Secondary | ICD-10-CM | POA: Diagnosis not present

## 2013-06-27 DIAGNOSIS — I4891 Unspecified atrial fibrillation: Secondary | ICD-10-CM | POA: Insufficient documentation

## 2013-06-27 DIAGNOSIS — I509 Heart failure, unspecified: Secondary | ICD-10-CM | POA: Diagnosis not present

## 2013-06-27 DIAGNOSIS — R5381 Other malaise: Secondary | ICD-10-CM | POA: Insufficient documentation

## 2013-06-27 DIAGNOSIS — Z79899 Other long term (current) drug therapy: Secondary | ICD-10-CM | POA: Insufficient documentation

## 2013-06-27 DIAGNOSIS — E860 Dehydration: Secondary | ICD-10-CM | POA: Insufficient documentation

## 2013-06-27 DIAGNOSIS — Z8709 Personal history of other diseases of the respiratory system: Secondary | ICD-10-CM | POA: Diagnosis not present

## 2013-06-27 DIAGNOSIS — I10 Essential (primary) hypertension: Secondary | ICD-10-CM | POA: Insufficient documentation

## 2013-06-27 DIAGNOSIS — Z7982 Long term (current) use of aspirin: Secondary | ICD-10-CM | POA: Insufficient documentation

## 2013-06-27 DIAGNOSIS — I959 Hypotension, unspecified: Secondary | ICD-10-CM | POA: Diagnosis present

## 2013-06-27 LAB — COMPREHENSIVE METABOLIC PANEL
ALT: 15 U/L (ref 0–35)
AST: 27 U/L (ref 0–37)
Albumin: 3.4 g/dL — ABNORMAL LOW (ref 3.5–5.2)
Alkaline Phosphatase: 80 U/L (ref 39–117)
Calcium: 10.3 mg/dL (ref 8.4–10.5)
Potassium: 3.9 mEq/L (ref 3.5–5.1)
Sodium: 140 mEq/L (ref 135–145)
Total Protein: 7.6 g/dL (ref 6.0–8.3)

## 2013-06-27 LAB — URINALYSIS, ROUTINE W REFLEX MICROSCOPIC
Glucose, UA: NEGATIVE mg/dL
Hgb urine dipstick: NEGATIVE
Leukocytes, UA: NEGATIVE
Specific Gravity, Urine: 1.015 (ref 1.005–1.030)
Urobilinogen, UA: 0.2 mg/dL (ref 0.0–1.0)
pH: 5.5 (ref 5.0–8.0)

## 2013-06-27 LAB — CBC WITH DIFFERENTIAL/PLATELET
Basophils Absolute: 0 10*3/uL (ref 0.0–0.1)
Basophils Relative: 0 % (ref 0–1)
Eosinophils Absolute: 0 10*3/uL (ref 0.0–0.7)
Eosinophils Relative: 0 % (ref 0–5)
MCH: 30.8 pg (ref 26.0–34.0)
MCHC: 32.6 g/dL (ref 30.0–36.0)
MCV: 94.4 fL (ref 78.0–100.0)
Monocytes Relative: 8 % (ref 3–12)
Neutrophils Relative %: 70 % (ref 43–77)
Platelets: 243 10*3/uL (ref 150–400)
RBC: 6.21 MIL/uL — ABNORMAL HIGH (ref 3.87–5.11)
RDW: 15.6 % — ABNORMAL HIGH (ref 11.5–15.5)
WBC: 10.7 10*3/uL — ABNORMAL HIGH (ref 4.0–10.5)

## 2013-06-27 LAB — LACTIC ACID, PLASMA: Lactic Acid, Venous: 2.4 mmol/L — ABNORMAL HIGH (ref 0.5–2.2)

## 2013-06-27 MED ORDER — SODIUM CHLORIDE 0.9 % IV BOLUS (SEPSIS)
500.0000 mL | Freq: Once | INTRAVENOUS | Status: AC
  Administered 2013-06-27: 500 mL via INTRAVENOUS

## 2013-06-27 NOTE — ED Notes (Signed)
Home health sent pt here due to hypotension. Thursday pt had bp of 80/50. Today bp was 89/71. Pt c/o n/v since Thursday. 3-4 times in last 24 hrs. Mm wet. Nad. Denies pain. Alert/oriented.

## 2013-06-27 NOTE — ED Provider Notes (Signed)
CSN: [id number]     Arrival date & time 06/27/13  1526 History  This chart was scribed for Laurie Lennert, MD by Leone Payor, ED Scribe. This patient was seen in room APA09/APA09 and the patient's care was started 3:48 PM.    Chief Complaint  Patient presents with  . Hypotension    Patient is a 79 y.o. female presenting with weakness. The history is provided by the patient (the pt complains of mild weakness). No language interpreter was used.  Weakness This is a new problem. The current episode started more than 2 days ago. The problem occurs hourly. The problem has not changed since onset.Pertinent negatives include no chest pain, no abdominal pain and no headaches. Nothing aggravates the symptoms.    HPI Comments: Laurie Ramirez is a 78 y.o. Female with past medical history of HTN, A-fib, CHF who presents to the Emergency Department complaining of hypotension that began 4 days ago. Per nursing note, pt's BP was around 80/50 last week. Pt also reports have several episodes of emesis in the past 24 hours. She was seen here on 06/18/13 for SOB. Pt was recently discharged from here 6 days ago. She denies any pain at this time.    Past Medical History  Diagnosis Date  . Hypertension   . Irregular heart rate   . A-fib   . Bronchitis   . CHF (congestive heart failure)    Past Surgical History  Procedure Laterality Date  . Shoulder surgery     History reviewed. No pertinent family history. History  Substance Use Topics  . Smoking status: Never Smoker   . Smokeless tobacco: Not on file  . Alcohol Use: No   OB History   Grav Para Term Preterm Abortions TAB SAB Ect Mult Living                 Review of Systems  Constitutional: Negative for appetite change and fatigue.  HENT: Negative for congestion, ear discharge and sinus pressure.   Eyes: Negative for discharge.  Respiratory: Negative for cough.   Cardiovascular: Negative for chest pain.  Gastrointestinal: Positive for  nausea. Negative for abdominal pain and diarrhea.  Genitourinary: Negative for frequency and hematuria.  Musculoskeletal: Negative for back pain.  Skin: Negative for rash.  Neurological: Positive for weakness. Negative for headaches.  Psychiatric/Behavioral: Negative for hallucinations.    Allergies  Review of patient's allergies indicates no known allergies.  Home Medications   Current Outpatient Rx  Name  Route  Sig  Dispense  Refill  . amiodarone (PACERONE) 200 MG tablet   Oral   Take 200 mg by mouth daily.         Marland Kitchen aspirin EC 81 MG tablet   Oral   Take 81 mg by mouth daily.         . DULoxetine (CYMBALTA) 30 MG capsule   Oral   Take 30 mg by mouth daily.         . furosemide (LASIX) 40 MG tablet      Take 1 tab po bid for 5 days then take one tab po daily   30 tablet   1   . lisinopril-hydrochlorothiazide (PRINZIDE,ZESTORETIC) 20-12.5 MG per tablet   Oral   Take 1 tablet by mouth 2 (two) times daily.         . metoprolol (LOPRESSOR) 100 MG tablet   Oral   Take 100 mg by mouth 2 (two) times daily.         Marland Kitchen  Olopatadine HCl (PATADAY) 0.2 % SOLN   Ophthalmic   Apply 1 drop to eye daily as needed (allergies).         . potassium chloride SA (K-DUR,KLOR-CON) 20 MEQ tablet   Oral   Take 2 tablets (40 mEq total) by mouth daily.   60 tablet   1    BP 98/57  Pulse 114  Temp(Src) 97.3 F (36.3 C) (Oral)  Resp 16  Ht 5\' 1"  (1.549 m)  Wt 204 lb (92.534 kg)  BMI 38.57 kg/m2  SpO2 95% Physical Exam  Constitutional: She is oriented to person, place, and time. She appears well-developed.  HENT:  Head: Normocephalic.  Mouth/Throat: Mucous membranes are dry.  Eyes: Conjunctivae and EOM are normal. No scleral icterus.  Neck: Neck supple. No thyromegaly present.  Cardiovascular: Normal rate and regular rhythm.  Exam reveals no gallop and no friction rub.   No murmur heard. Pulmonary/Chest: No stridor. She has no wheezes. She has no rales. She  exhibits no tenderness.  Abdominal: She exhibits no distension. There is no tenderness. There is no rebound.  Musculoskeletal: Normal range of motion. She exhibits no edema.  Lymphadenopathy:    She has no cervical adenopathy.  Neurological: She is oriented to person, place, and time. She exhibits normal muscle tone. Coordination normal.  Skin: No rash noted. No erythema.  Psychiatric: She has a normal mood and affect. Her behavior is normal.    ED Course  Procedures   DIAGNOSTIC STUDIES: Oxygen Saturation is 95% on RA, adequate by my interpretation.    COORDINATION OF CARE: 4:09 PM Will order CXR and lab work. Discussed treatment plan with pt at bedside and pt agreed to plan.   7:17 PM Upon recheck, pt states she feels better but her lips feel dry. Will order more IV fluid.   8:39 PM Discussed pt's discharge plan with pt and her family.   Labs Review Labs Reviewed  CBC WITH DIFFERENTIAL - Abnormal; Notable for the following:    WBC 10.7 (*)    RBC 6.21 (*)    Hemoglobin 19.1 (*)    HCT 58.6 (*)    RDW 15.6 (*)    All other components within normal limits  COMPREHENSIVE METABOLIC PANEL - Abnormal; Notable for the following:    Chloride 92 (*)    CO2 34 (*)    Glucose, Bld 144 (*)    BUN 48 (*)    Creatinine, Ser 2.42 (*)    Albumin 3.4 (*)    GFR calc non Af Amer 18 (*)    GFR calc Af Amer 21 (*)    All other components within normal limits  PRO B NATRIURETIC PEPTIDE - Abnormal; Notable for the following:    Pro B Natriuretic peptide (BNP) 2520.0 (*)    All other components within normal limits  LACTIC ACID, PLASMA - Abnormal; Notable for the following:    Lactic Acid, Venous 2.4 (*)    All other components within normal limits  TROPONIN I  URINALYSIS, ROUTINE W REFLEX MICROSCOPIC   Imaging Review Dg Chest Port 1 View  06/27/2013   CLINICAL DATA:  Weakness.  EXAM: PORTABLE CHEST - 1 VIEW  COMPARISON:  06/18/2013  FINDINGS: Lungs are hypoinflated without definite  consolidation or effusion. There is stable cardiomegaly. There is calcification of the aortic arch. Moderate degenerative change of the glenohumeral joints bilaterally.  IMPRESSION: Hypoinflation without acute cardiopulmonary disease.  Stable cardiomegaly.   Electronically Signed   By: Reuel Boom  Micheline Maze M.D.   On: 06/27/2013 16:26    EKG Interpretation   None       MDM  No diagnosis found. The chart was scribed for me under my direct supervision.  I personally performed the history, physical, and medical decision making and all procedures in the evaluation of this patient.Laurie Lennert, MD 06/27/13 2042

## 2013-06-27 NOTE — ED Notes (Signed)
Md at bedside to discuss disposition

## 2013-06-27 NOTE — ED Notes (Signed)
Pt provided water per request. No other complaints at this time

## 2013-07-08 ENCOUNTER — Encounter (HOSPITAL_COMMUNITY): Payer: Self-pay

## 2013-07-08 ENCOUNTER — Ambulatory Visit (INDEPENDENT_AMBULATORY_CARE_PROVIDER_SITE_OTHER): Payer: Medicare Other | Admitting: Cardiology

## 2013-07-08 ENCOUNTER — Encounter: Payer: Self-pay | Admitting: Cardiology

## 2013-07-08 ENCOUNTER — Inpatient Hospital Stay (HOSPITAL_COMMUNITY)
Admission: AD | Admit: 2013-07-08 | Discharge: 2013-07-16 | DRG: 438 | Disposition: A | Discharge status: Skilled Nursing Facility | Payer: Medicare Other | Source: Ambulatory Visit | Attending: Neurology | Admitting: Neurology

## 2013-07-08 VITALS — BP 90/50 | HR 95 | Ht 60.0 in | Wt 206.1 lb

## 2013-07-08 DIAGNOSIS — G8190 Hemiplegia, unspecified affecting unspecified side: Secondary | ICD-10-CM | POA: Diagnosis not present

## 2013-07-08 DIAGNOSIS — I5022 Chronic systolic (congestive) heart failure: Secondary | ICD-10-CM

## 2013-07-08 DIAGNOSIS — D45 Polycythemia vera: Secondary | ICD-10-CM | POA: Diagnosis present

## 2013-07-08 DIAGNOSIS — I5042 Chronic combined systolic (congestive) and diastolic (congestive) heart failure: Secondary | ICD-10-CM | POA: Diagnosis present

## 2013-07-08 DIAGNOSIS — I509 Heart failure, unspecified: Secondary | ICD-10-CM | POA: Diagnosis present

## 2013-07-08 DIAGNOSIS — Z79899 Other long term (current) drug therapy: Secondary | ICD-10-CM

## 2013-07-08 DIAGNOSIS — N179 Acute kidney failure, unspecified: Secondary | ICD-10-CM

## 2013-07-08 DIAGNOSIS — I513 Intracardiac thrombosis, not elsewhere classified: Secondary | ICD-10-CM

## 2013-07-08 DIAGNOSIS — Z7901 Long term (current) use of anticoagulants: Secondary | ICD-10-CM

## 2013-07-08 DIAGNOSIS — Z6836 Body mass index (BMI) 36.0-36.9, adult: Secondary | ICD-10-CM

## 2013-07-08 DIAGNOSIS — Z8249 Family history of ischemic heart disease and other diseases of the circulatory system: Secondary | ICD-10-CM

## 2013-07-08 DIAGNOSIS — I129 Hypertensive chronic kidney disease with stage 1 through stage 4 chronic kidney disease, or unspecified chronic kidney disease: Secondary | ICD-10-CM | POA: Diagnosis present

## 2013-07-08 DIAGNOSIS — I4891 Unspecified atrial fibrillation: Secondary | ICD-10-CM

## 2013-07-08 DIAGNOSIS — E785 Hyperlipidemia, unspecified: Secondary | ICD-10-CM | POA: Diagnosis present

## 2013-07-08 DIAGNOSIS — K859 Acute pancreatitis without necrosis or infection, unspecified: Secondary | ICD-10-CM

## 2013-07-08 DIAGNOSIS — E876 Hypokalemia: Secondary | ICD-10-CM | POA: Diagnosis present

## 2013-07-08 DIAGNOSIS — I6340 Cerebral infarction due to embolism of unspecified cerebral artery: Secondary | ICD-10-CM | POA: Diagnosis not present

## 2013-07-08 DIAGNOSIS — I639 Cerebral infarction, unspecified: Secondary | ICD-10-CM

## 2013-07-08 DIAGNOSIS — I959 Hypotension, unspecified: Secondary | ICD-10-CM

## 2013-07-08 DIAGNOSIS — F3289 Other specified depressive episodes: Secondary | ICD-10-CM | POA: Diagnosis present

## 2013-07-08 DIAGNOSIS — N189 Chronic kidney disease, unspecified: Secondary | ICD-10-CM | POA: Insufficient documentation

## 2013-07-08 DIAGNOSIS — R4701 Aphasia: Secondary | ICD-10-CM | POA: Diagnosis not present

## 2013-07-08 DIAGNOSIS — R112 Nausea with vomiting, unspecified: Secondary | ICD-10-CM

## 2013-07-08 DIAGNOSIS — I10 Essential (primary) hypertension: Secondary | ICD-10-CM

## 2013-07-08 DIAGNOSIS — R1312 Dysphagia, oropharyngeal phase: Secondary | ICD-10-CM | POA: Diagnosis not present

## 2013-07-08 DIAGNOSIS — F329 Major depressive disorder, single episode, unspecified: Secondary | ICD-10-CM | POA: Diagnosis present

## 2013-07-08 DIAGNOSIS — I5189 Other ill-defined heart diseases: Secondary | ICD-10-CM | POA: Diagnosis present

## 2013-07-08 HISTORY — DX: Intracardiac thrombosis, not elsewhere classified: I51.3

## 2013-07-08 HISTORY — DX: Chronic systolic (congestive) heart failure: I50.22

## 2013-07-08 LAB — CBC
Hemoglobin: 18.5 g/dL — ABNORMAL HIGH (ref 12.0–15.0)
MCH: 31.7 pg (ref 26.0–34.0)
MCHC: 33.2 g/dL (ref 30.0–36.0)
MCV: 95.4 fL (ref 78.0–100.0)
Platelets: 182 10*3/uL (ref 150–400)
RBC: 5.84 MIL/uL — ABNORMAL HIGH (ref 3.87–5.11)
RDW: 15.5 % (ref 11.5–15.5)

## 2013-07-08 LAB — COMPREHENSIVE METABOLIC PANEL
ALT: 11 U/L (ref 0–35)
AST: 26 U/L (ref 0–37)
Albumin: 3.2 g/dL — ABNORMAL LOW (ref 3.5–5.2)
Alkaline Phosphatase: 113 U/L (ref 39–117)
CO2: 33 mEq/L — ABNORMAL HIGH (ref 19–32)
Calcium: 10.5 mg/dL (ref 8.4–10.5)
Creatinine, Ser: 3.16 mg/dL — ABNORMAL HIGH (ref 0.50–1.10)
GFR calc non Af Amer: 13 mL/min — ABNORMAL LOW (ref >90)
Glucose, Bld: 154 mg/dL — ABNORMAL HIGH (ref 70–99)
Potassium: 4.9 mEq/L (ref 3.5–5.1)
Sodium: 137 mEq/L (ref 135–145)
Total Protein: 7.6 g/dL (ref 6.0–8.3)

## 2013-07-08 MED ORDER — SODIUM CHLORIDE 0.9 % IJ SOLN
3.0000 mL | Freq: Two times a day (BID) | INTRAMUSCULAR | Status: DC
  Administered 2013-07-08 – 2013-07-11 (×5): 3 mL via INTRAVENOUS

## 2013-07-08 MED ORDER — ONDANSETRON HCL 4 MG/2ML IJ SOLN
4.0000 mg | Freq: Four times a day (QID) | INTRAMUSCULAR | Status: DC | PRN
  Administered 2013-07-08: 4 mg via INTRAVENOUS
  Filled 2013-07-08: qty 2

## 2013-07-08 MED ORDER — DULOXETINE HCL 30 MG PO CPEP
30.0000 mg | ORAL_CAPSULE | Freq: Every day | ORAL | Status: DC
  Administered 2013-07-09 – 2013-07-11 (×3): 30 mg via ORAL
  Filled 2013-07-08 (×3): qty 1

## 2013-07-08 MED ORDER — OLOPATADINE HCL 0.1 % OP SOLN
1.0000 [drp] | Freq: Two times a day (BID) | OPHTHALMIC | Status: DC | PRN
  Filled 2013-07-08: qty 5

## 2013-07-08 MED ORDER — ONDANSETRON HCL 4 MG PO TABS: 4.0000 mg | ORAL_TABLET | Freq: Four times a day (QID) | ORAL | Status: DC | PRN

## 2013-07-08 MED ORDER — SODIUM CHLORIDE 0.9 % IV SOLN
INTRAVENOUS | Status: DC
  Administered 2013-07-08 – 2013-07-11 (×5): via INTRAVENOUS

## 2013-07-08 MED ORDER — ACETAMINOPHEN 650 MG RE SUPP: 650.0000 mg | Freq: Four times a day (QID) | RECTAL | Status: DC | PRN

## 2013-07-08 MED ORDER — METOPROLOL TARTRATE 100 MG PO TABS
100.0000 mg | ORAL_TABLET | Freq: Two times a day (BID) | ORAL | Status: DC
  Administered 2013-07-08 – 2013-07-11 (×6): 100 mg via ORAL
  Filled 2013-07-08 (×7): qty 2

## 2013-07-08 MED ORDER — ALUM & MAG HYDROXIDE-SIMETH 200-200-20 MG/5ML PO SUSP: 30.0000 mL | Freq: Four times a day (QID) | ORAL | Status: DC | PRN

## 2013-07-08 MED ORDER — ACETAMINOPHEN 325 MG PO TABS
650.0000 mg | ORAL_TABLET | Freq: Four times a day (QID) | ORAL | Status: DC | PRN
  Administered 2013-07-09: 650 mg via ORAL
  Filled 2013-07-08: qty 2

## 2013-07-08 MED ORDER — HEPARIN SODIUM (PORCINE) 5000 UNIT/ML IJ SOLN
5000.0000 [IU] | Freq: Three times a day (TID) | INTRAMUSCULAR | Status: DC
  Administered 2013-07-08 – 2013-07-11 (×9): 5000 [IU] via SUBCUTANEOUS
  Filled 2013-07-08 (×11): qty 1

## 2013-07-08 NOTE — Patient Instructions (Signed)
Pt transferred to Admitting via wheelchair in accompaniment of family member with Technical brewer

## 2013-07-08 NOTE — Assessment & Plan Note (Signed)
I do not have complete records, but my understanding is that this is chronic atrial fibrillation. The patient's son-in-law tells me that she was on Xarelto per Dr. Alanda Amass, although the medication was stopped. She had a fall and hit her head at one point. It is not clear to me why she is on amiodarone, and this should be stopped at the present time, with followup CMET and TSH. With hydration and stabilization of her blood pressure, can add back beta blocker for rate control.

## 2013-07-08 NOTE — Assessment & Plan Note (Signed)
More accurately this is probably chronic combined heart failure with mild systolic dysfunction and presumably some degree of diastolic dysfunction as well the setting of atrial fibrillation. Most recent LVEF 45-50% back in August. Would suggest a followup echocardiogram.

## 2013-07-08 NOTE — Assessment & Plan Note (Signed)
Now relatively hypotensive in the setting of dehydration and acute on chronic renal failure. Hold ACE inhibitor, Lasix, and beta blocker.

## 2013-07-08 NOTE — H&P (Signed)
History and Physical  Laurie Ramirez:[id number] DOB: 1931/10/29 DOA: 07/08/2013  Referring physician: Nona Dell, M.D. PCP: Colette Ribas, MD   Chief Complaint: Vomiting  HPI:  79 year old woman with history of chronic systolic heart failure and atrial fibrillation was seen by her cardiologist in the outpatient setting today for followup where she was found to be mildly hypotensive and reported history of vomiting and poor oral intake.she suspected to have acute on chronic renal failure causing hypotension it was felt she would benefit from admission for further evaluation and treatment.  History obtained from chart and patient as well as stepdaughter and son-in-law bedside. Patient was hospitalized at the end of last month for acute heart failure and discharged home on diuretics including Lasix and hydrochlorothiazide. She is felt generally weak which has progressed over the last 2 weeks. She was seen in the emergency Department 11/34 for generalized weakness, at that time was noted to have acute renal failure. For the last week she has had intermittent vomiting some days, no vomiting and other days. She has continued good fluid intake but has had poor appetite and poor oral intake. Presently no abdominal pain or diarrhea. No other complaints. She is continuing to take her medications. However over the last several days he was noted her blood pressure was somewhat lower than it was recommended that she decrease her metoprolol dose.  In the cardiology office her blood pressure is 90/50.  Review of Systems:  Negative for fever, visual changes, sore throat, rash, new muscle aches, chest pain, SOB, dysuria, bleeding, diarrhea.  Past Medical History  Diagnosis Date  . Essential hypertension, benign   . Atrial fibrillation   . Bronchitis   . Depression   . Chronic combined systolic and diastolic heart failure      LVEF 45-50%    Past Surgical History  Procedure Laterality Date   . Shoulder surgery      right    Social History:  reports that she has never smoked. She does not have any smokeless tobacco history on file. She reports that she does not drink alcohol or use illicit drugs.  No Known Allergies  No family history on file. Mother with heart failure  Prior to Admission medications   Medication Sig Start Date End Date Taking? Authorizing Provider  acetaminophen (TYLENOL) 500 MG tablet Take 1,000 mg by mouth every 6 (six) hours as needed for pain.    Historical Provider, MD  amiodarone (PACERONE) 200 MG tablet Take 200 mg by mouth daily.    Historical Provider, MD  aspirin EC 81 MG tablet Take 81 mg by mouth daily.    Historical Provider, MD  DULoxetine (CYMBALTA) 30 MG capsule Take 30 mg by mouth daily.    Historical Provider, MD  furosemide (LASIX) 40 MG tablet Take 40 mg by mouth daily.     Historical Provider, MD  lisinopril-hydrochlorothiazide (PRINZIDE,ZESTORETIC) 20-12.5 MG per tablet Take 1 tablet by mouth 2 (two) times daily.    Historical Provider, MD  metoprolol (LOPRESSOR) 100 MG tablet Take 100 mg by mouth 2 (two) times daily.    Historical Provider, MD  Olopatadine HCl (PATADAY) 0.2 % SOLN Apply 1 drop to eye daily as needed (allergies).    Historical Provider, MD  ondansetron (ZOFRAN) 4 MG tablet Take 4 mg by mouth every 8 (eight) hours as needed for nausea or vomiting.    Historical Provider, MD  potassium chloride SA (K-DUR,KLOR-CON) 20 MEQ tablet Take 2 tablets (40 mEq total)  by mouth daily. 06/21/13   Erick Blinks, MD   Physical Exam: Filed Vitals:   07/08/13 1610  BP: 133/87  Pulse: 56  Temp: 97.4 F (36.3 C)  TempSrc: Oral  Height: 5\' 4"  (1.626 m)  Weight: 92.806 kg (204 lb 9.6 oz)  SpO2: 95%    General:  Appears calm and comfortable. Nontoxic. Eyes: PERRL, normal lids, irises & conjunctiva ENT: grossly normal hearing, lips & tongue Neck: no LAD, masses or thyromegaly Cardiovascular: Irregular, no m/r/g. No LE  edema. Respiratory: CTA bilaterally, no w/r/r. Normal respiratory effort. Abdomen: soft, ntnd Skin: no rash or induration noted Musculoskeletal: grossly normal tone BUE/BLE. Both feet are somewhat cool with intact dorsalis pedis pulses. Multiple abrasions in various stages of healing on the foot as well as toes. No evidence of cellulitis. Psychiatric: grossly normal mood and affect, speech fluent and appropriate Neurologic: grossly non-focal.  Wt Readings from Last 3 Encounters:  07/08/13 93.477 kg (206 lb 1.3 oz)  06/27/13 92.534 kg (204 lb)  06/21/13 99.2 kg (218 lb 11.1 oz)    Labs on Admission:  Basic Metabolic Panel:  Recent Labs Lab 07/08/13 1616  NA 137  K 4.9  CL 92*  CO2 33*  GLUCOSE 154*  BUN 63*  CREATININE 3.16*  CALCIUM 10.5    Liver Function Tests:  Recent Labs Lab 07/08/13 1616  AST 26  ALT 11  ALKPHOS 113  BILITOT 1.2  PROT 7.6  ALBUMIN 3.2*    Recent Labs Lab 07/08/13 1616  LIPASE 97*    CBC:  Recent Labs Lab 07/08/13 1616  WBC 9.8  HGB 18.5*  HCT 55.7*  MCV 95.4  PLT 182     EKG: Independently reviewed. Atrial fibrillation with rapid ventricular response. Right bundle branch block. No acute changes. Right bundle branch block is old compared to previous studies.   Principal Problem:   Acute renal failure Active Problems:   Chronic systolic heart failure   Atrial fibrillation   Essential hypertension, benign   Nausea and vomiting   Pancreatitis, acute   Hypotension, unspecified   Assessment/Plan 1. Nausea and vomiting: Possibly secondary to pancreatitis versus viral illness. Check lipid panel. Abdominal exam benign. Repeat laboratory studies in the morning. 2. Possible pancreatitis: Plan as above. 3. Acute renal failure: Likely secondary to vomiting, poor oral intake and continue diuretic therapy as well as ACE inhibitor. IV fluids. Repeat urinalysis. Followup laboratory studies in the morning. 4. Hypotension: Reported  earlier. Currently normotensive. Likely secondary to dehydration. 5. Chronic systolic/diastolic heart failure: Left ventricular ejection fraction 03/2013:45-50%. Indeterminate diastolic function. Obtain 2-D echocardiogram per cardiology recommendations. Appears dry. 6. Chronic atrial fibrillation: Not anticoagulated secondary to severe bruising. CHADSVASC score is 5-6. Discontinue amiodarone per cardiology. Aspirin. Continue rate control.  Code Status: full code DVT prophylaxis:heparin Family Communication: Discussed with stepdaughter and son-in-law at bedside Disposition Plan/Anticipated LOS: Admission. 2-3 days.  Time spent: 50 minutes  [name], MD  [facility] Pager [phone] 07/08/2013, 3:27 PM

## 2013-07-08 NOTE — Assessment & Plan Note (Signed)
Patient presents to the office to establish as a new cardiology patient, complaining of progressive nausea and emesis over the last one to 2 weeks. Most recent hospitalization was secondary to acute on chronic combined heart failure, she was diuresis with intravenous Lasix, has continued on Lasix at home. She now has evidence of dehydration and acute on chronic renal failure comforted by poor oral intake. Also relatively hypotensive, heart rate in the 90-100 range in atrial fibrillation most likely, ECG was not obtained in the office. The patient's son-in-law has held her beta blocker over the last few days secondary to low blood pressure. I reviewed the situation with the patient and have recommended admission to the hospital for further stabilization and adjustment of medical therapy. I spoke with Dr. Irene Limbo who will be admitting the patient to telemetry. Our service can follow in consultation.

## 2013-07-08 NOTE — Progress Notes (Signed)
Clinical Summary Laurie Ramirez is a 79 y.o.female referred to the office as a new patient by Dr. Kerry Hough following recent hospitalization at [facility]. She is a former patient of Dr. Alanda Amass. No cardiac records are available to me at this time. Recent admission was related to acute on chronic systolic heart failure, managed with IV diuresis. Available records indicate chronic atrial fibrillation, not anticoagulated related to "severe bruising." CHADSVASC score is 5-6.  She is here today with her son in law. She presents to the office complaining of nausea and emesis, gradual symptom onset over the last one to 2 weeks. She states she was given Zofran by her primary care provider approximately a week ago. Has not been able to take regular oral intake including some medications. Home blood pressures have been trending down, most recently systolic in the 90s. She has not been given her beta blocker in the last 2 days.  Echocardiogram from August of this year revealed mildly dilated LV with normal wall thickness, LVEF 45-50%, indeterminate diastolic function, mild aortic regurgitation, mild mitral regurgitation, severely dilated left atrium, moderate to severely dilated right atrium, moderate tricuspid regurgitation, RV-RA gradient 16 mm mercury with normal CVP.  Recent lab work showed potassium 3.9, BUN 48, creatinine 2.4 which was up from 1.1 in October, AST 27, ALT 15, hemoglobin 19.1, platelets 243, proBNP 2520 which was down from 11470. Recent ECG showed atrial fibrillation with right bundle branch block.   No Known Allergies  Current Outpatient Prescriptions  Medication Sig Dispense Refill  . acetaminophen (TYLENOL) 500 MG tablet Take 1,000 mg by mouth every 6 (six) hours as needed for pain.      Marland Kitchen amiodarone (PACERONE) 200 MG tablet Take 200 mg by mouth daily.      Marland Kitchen aspirin EC 81 MG tablet Take 81 mg by mouth daily.      . DULoxetine (CYMBALTA) 30 MG capsule Take 30 mg by mouth daily.        . furosemide (LASIX) 40 MG tablet Take 40 mg by mouth daily.       Marland Kitchen lisinopril-hydrochlorothiazide (PRINZIDE,ZESTORETIC) 20-12.5 MG per tablet Take 1 tablet by mouth 2 (two) times daily.      . metoprolol (LOPRESSOR) 100 MG tablet Take 100 mg by mouth 2 (two) times daily.      . Olopatadine HCl (PATADAY) 0.2 % SOLN Apply 1 drop to eye daily as needed (allergies).      . ondansetron (ZOFRAN) 4 MG tablet Take 4 mg by mouth every 8 (eight) hours as needed for nausea or vomiting.      . potassium chloride SA (K-DUR,KLOR-CON) 20 MEQ tablet Take 2 tablets (40 mEq total) by mouth daily.  60 tablet  1   No current facility-administered medications for this visit.    Past Medical History  Diagnosis Date  . Essential hypertension, benign   . Atrial fibrillation   . Bronchitis   . Depression   . Chronic combined systolic and diastolic heart failure      LVEF 45-50%    Past Surgical History  Procedure Laterality Date  . Shoulder surgery      History reviewed. No pertinent family history.  Social History Laurie Ramirez reports that she has never smoked. She does not have any smokeless tobacco history on file. Laurie Ramirez reports that she does not drink alcohol.  Review of Systems As outlined above.  Physical Examination Filed Vitals:   07/08/13 1357  BP: 90/50  Pulse: 95  Filed Weights   07/08/13 1357  Weight: 206 lb 1.3 oz (93.477 kg)   Chronically ill-appearing, morbidly obese woman, states that she feels poorly. HEENT: Conjunctiva and lids normal, oropharynx clear. Neck: Supple, no elevated JVP or carotid bruits, no thyromegaly. Lungs: Clear to auscultation, decreased at bases, nonlabored breathing at rest. Cardiac: Distant, irregularly irregular, no S3 or significant systolic murmur, no pericardial rub. Abdomen: Soft, nontender, protuberant, bowel sounds present, no guarding or rebound. Extremities: No pitting edema, distal pulses 1+. Skin: Warm and dry. Musculoskeletal:  No kyphosis. Neuropsychiatric: Alert and oriented x3, affect grossly appropriate.   Problem List and Plan   Acute on chronic renal failure Patient presents to the office to establish as a new cardiology patient, complaining of progressive nausea and emesis over the last one to 2 weeks. Most recent hospitalization was secondary to acute on chronic combined heart failure, she was diuresis with intravenous Lasix, has continued on Lasix at home. She now has evidence of dehydration and acute on chronic renal failure comforted by poor oral intake. Also relatively hypotensive, heart rate in the 90-100 range in atrial fibrillation most likely, ECG was not obtained in the office. The patient's son-in-law has held her beta blocker over the last few days secondary to low blood pressure. I reviewed the situation with the patient and have recommended admission to the hospital for further stabilization and adjustment of medical therapy. I spoke with Dr. Irene Limbo who will be admitting the patient to telemetry. Our service can follow in consultation.   Atrial fibrillation I do not have complete records, but my understanding is that this is chronic atrial fibrillation. The patient's son-in-law tells me that she was on Xarelto per Dr. Alanda Amass, although the medication was stopped. She had a fall and hit her head at one point. It is not clear to me why she is on amiodarone, and this should be stopped at the present time, with followup CMET and TSH. With hydration and stabilization of her blood pressure, can add back beta blocker for rate control.  Chronic systolic heart failure More accurately this is probably chronic combined heart failure with mild systolic dysfunction and presumably some degree of diastolic dysfunction as well the setting of atrial fibrillation. Most recent LVEF 45-50% back in August. Would suggest a followup echocardiogram.  Essential hypertension, benign Now relatively hypotensive in the setting  of dehydration and acute on chronic renal failure. Hold ACE inhibitor, Lasix, and beta blocker.    Jonelle Sidle, M.D., F.A.C.C.

## 2013-07-09 LAB — URINALYSIS, ROUTINE W REFLEX MICROSCOPIC
Bilirubin Urine: NEGATIVE
Glucose, UA: NEGATIVE mg/dL
Ketones, ur: NEGATIVE mg/dL
Nitrite: NEGATIVE
Protein, ur: NEGATIVE mg/dL
Specific Gravity, Urine: 1.02 (ref 1.005–1.030)
Urobilinogen, UA: 0.2 mg/dL (ref 0.0–1.0)
pH: 5.5 (ref 5.0–8.0)

## 2013-07-09 LAB — CBC
HCT: 53.5 % — ABNORMAL HIGH (ref 36.0–46.0)
Hemoglobin: 17.1 g/dL — ABNORMAL HIGH (ref 12.0–15.0)
RBC: 5.59 MIL/uL — ABNORMAL HIGH (ref 3.87–5.11)

## 2013-07-09 LAB — LIPID PANEL
Cholesterol: 197 mg/dL (ref 0–200)
Total CHOL/HDL Ratio: 4.2 RATIO
Triglycerides: 170 mg/dL — ABNORMAL HIGH (ref <150)

## 2013-07-09 LAB — LIPASE, BLOOD: Lipase: 81 U/L — ABNORMAL HIGH (ref 11–59)

## 2013-07-09 LAB — BASIC METABOLIC PANEL
Chloride: 96 mEq/L (ref 96–112)
GFR calc Af Amer: 17 mL/min — ABNORMAL LOW (ref >90)
GFR calc non Af Amer: 15 mL/min — ABNORMAL LOW (ref >90)
Potassium: 4.4 mEq/L (ref 3.5–5.1)
Sodium: 137 mEq/L (ref 135–145)

## 2013-07-09 LAB — URINE MICROSCOPIC-ADD ON

## 2013-07-09 NOTE — Progress Notes (Signed)
[facility] PROGRESS NOTE  Laurie Ramirez ZOX:[id number] DOB: 03-08-1932 DOA: 07/08/2013 PCP: Colette Ribas, MD  Assessment/Plan: 1. Nausea and vomiting: Resolved. Pancreatitis versus viral illness. Supportive care. 2. Possible acute pancreatitis: Lipase elevation is nonspecific. Etiology unclear. Triglycerides minimally elevated 170. Lipid panel otherwise unremarkable. Asymptomatic at this point. Advance diet. 3. Acute renal failure: Secondary to poor oral intake, continue diuretic therapy, ACE inhibitor and vomiting. Improving with IV fluids. 4. Hypotension: Borderline. Asymptomatic. Secondary to dehydration. Continue IV fluids. 5. Chronic systolic/diastolic congestive heart failure: Left ventricular ejection fraction 03/2013:45-50%. Indeterminate diastolic function. Appears stable. Continue to monitor volume status daily. 6. Chronic atrial fibrillation: stable. Not anticoagulated secondary to severe bruising. CHADSVASC score is 5-6. Discontinue amiodarone per cardiology. Aspirin. Continue rate control. 7. Polycythemia: Present since at least August 2014. Followup as an outpatient.   Decrease IV fluids.  Advance diet to full liquid  Repeat basic metabolic panel, CBC and lipase in the morning  Pending studies:   none  Code Status: full code DVT prophylaxis: heparin Family Communication: None present Disposition Plan: home when improved  [name], MD  [facility]  Pager [phone] If 7PM-7AM, please contact night-coverage at [email or web address], password [facility] 07/09/2013, 9:23 AM  LOS: 1 day   Summary: 79 year old woman with history of chronic systolic heart failure and atrial fibrillation was seen by her cardiologist in the outpatient setting today for followup where she was found to be mildly hypotensive and reported history of vomiting and poor oral intake.she suspected to have acute on chronic renal failure causing hypotension it was felt she would benefit  from admission for further evaluation and treatment.  Consultants:  none  Procedures:    Antibiotics:    HPI/Subjective: Feels better today. No vomiting. Nausea seems resolved. Tolerating liquids. No abdominal pain.  Objective: Filed Vitals:   07/08/13 1610 07/08/13 2054 07/09/13 0436  BP: 133/87 127/58 99/51  Pulse: 56 87 115  Temp: 97.4 F (36.3 C) 97.8 F (36.6 C) 98 F (36.7 C)  TempSrc: Oral Oral Oral  Resp:  20 20  Height: 5\' 4"  (1.626 m)    Weight: 92.806 kg (204 lb 9.6 oz)  94.3 kg (207 lb 14.3 oz)  SpO2: 95% 92% 95%    Intake/Output Summary (Last 24 hours) at 07/09/13 0923 Last data filed at 07/09/13 0800  Gross per 24 hour  Intake    960 ml  Output      0 ml  Net    960 ml     Filed Weights   07/08/13 1610 07/09/13 0436  Weight: 92.806 kg (204 lb 9.6 oz) 94.3 kg (207 lb 14.3 oz)    Exam:   Afebrile, vital signs stable, borderline blood pressure intermittently in the 90s. No hypoxia.  General: Appears calm and comfortable. Speech fluent and clear.  Cardiovascular: Regular rate and rhythm. No murmur, rub or gallop. No lower extremity edema.  Respiratory: Clear to auscultation bilaterally. No wheezes, rales or rhonchi. Normal respiratory effort.  Abdomen: Soft, nontender, nondistended.  Data Reviewed:  Creatinine 3.16 >> 2.3. BUN without significant change.  Lipase 97  >> 81  Hemoglobin decreased, 17.1.  Scheduled Meds: . DULoxetine  30 mg Oral Daily  . heparin  5,000 Units Subcutaneous Q8H  . metoprolol  100 mg Oral BID  . sodium chloride  3 mL Intravenous Q12H   Continuous Infusions: . sodium chloride 75 mL/hr at 07/09/13 0757    Principal Problem:   Acute renal failure Active Problems:   Chronic  systolic heart failure   Atrial fibrillation   Essential hypertension, benign   Nausea and vomiting   Pancreatitis, acute   Hypotension, unspecified   Time spent 20 minutes

## 2013-07-10 LAB — BASIC METABOLIC PANEL
BUN: 48 mg/dL — ABNORMAL HIGH (ref 6–23)
CO2: 34 mEq/L — ABNORMAL HIGH (ref 19–32)
Chloride: 97 mEq/L (ref 96–112)
GFR calc non Af Amer: 20 mL/min — ABNORMAL LOW (ref >90)
Glucose, Bld: 127 mg/dL — ABNORMAL HIGH (ref 70–99)
Potassium: 3.6 mEq/L (ref 3.5–5.1)
Sodium: 139 mEq/L (ref 135–145)

## 2013-07-10 NOTE — Progress Notes (Signed)
[facility] PROGRESS NOTE  Laurie Ramirez ZOX:[id number] DOB: 1932/06/20 DOA: 07/08/2013 PCP: Colette Ribas, MD  Assessment/Plan: 1. Nausea and vomiting: Resolved. Pancreatitis suspected. Supportive care. 2. Possible acute pancreatitis: Lipase now normal. Etiology unclear. Triglycerides minimally elevated 170. Lipid panel otherwise unremarkable. Asymptomatic at this point. Advance diet. Could have been medication induced. 3. Acute renal failure: Slowly improving. Secondary to poor oral intake, continue diuretic therapy, ACE inhibitor and vomiting.  4. Hypotension: Soft. Secondary to dehydration. 5. Chronic systolic/diastolic congestive heart failure: Left ventricular ejection fraction 03/2013:45-50%. Indeterminate diastolic function. Appears stable. Continue to monitor volume status daily. 6. Chronic atrial fibrillation: stable. Not anticoagulated secondary to severe bruising. CHADSVASC score is 5-6. Discontinue amiodarone per cardiology. Aspirin. Continue rate control. 7. Polycythemia: Present since at least August 2014. Followup as an outpatient.   Continue IV fluids.   Advance to bland diet.   Repeat basic metabolic panel in the morning  2-D echocardiogram in the morning as recommended by cardiology  Pending studies:   none  Code Status: full code DVT prophylaxis: heparin Family Communication: None present Disposition Plan: home when improved  [name], MD  [facility]  Pager [phone] If 7PM-7AM, please contact night-coverage at [email or web address], password [facility] 07/10/2013, 10:45 AM  LOS: 2 days   Summary: 79 year old woman with history of chronic systolic heart failure and atrial fibrillation was seen by her cardiologist in the outpatient setting today for followup where she was found to be mildly hypotensive and reported history of vomiting and poor oral intake.she suspected to have acute on chronic renal failure causing hypotension it was felt she  would benefit from admission for further evaluation and treatment.  Consultants:  none  Procedures:    Antibiotics:    HPI/Subjective: "I feel stronger". No nausea, vomiting or abdominal pain. Would like to advance diet.  Objective: Filed Vitals:   07/09/13 1002 07/09/13 1324 07/09/13 2058 07/10/13 0336  BP: 110/61 95/30 131/70 116/55  Pulse:  92 85 93  Temp:  97.9 F (36.6 C) 97.8 F (36.6 C) 97.8 F (36.6 C)  TempSrc:  Oral Oral Oral  Resp:  20 20 20   Height:      Weight:    95.3 kg (210 lb 1.6 oz)  SpO2:  95% 97% 95%    Intake/Output Summary (Last 24 hours) at 07/10/13 1045 Last data filed at 07/10/13 0630  Gross per 24 hour  Intake    480 ml  Output    550 ml  Net    -70 ml     Filed Weights   07/08/13 1610 07/09/13 0436 07/10/13 0336  Weight: 92.806 kg (204 lb 9.6 oz) 94.3 kg (207 lb 14.3 oz) 95.3 kg (210 lb 1.6 oz)    Exam:   Afebrile, vital signs stable. Hypotension has resolved.  Cardiovascular: Irregular, normal rate. No murmur, rub or gallop. No lower extremity edema.  General: Appears calm and comfortable.  Psychiatric: Grossly normal mood and affect. Speech fluent and appropriate.  Respiratory: Clear to auscultation bilaterally. No wheezes, rales or rhonchi. Normal respiratory effort.  Telemetry: Atrial fibrillation, rate controlled.   Data Reviewed:  Creatinine 3.16 >> 2.83 >> 2.25. BUN decreasing.  Lipase 97  >> 81 >> 42  Scheduled Meds: . DULoxetine  30 mg Oral Daily  . heparin  5,000 Units Subcutaneous Q8H  . metoprolol  100 mg Oral BID  . sodium chloride  3 mL Intravenous Q12H   Continuous Infusions: . sodium chloride 50 mL/hr at 07/10/13 0205  Principal Problem:   Acute renal failure Active Problems:   Chronic systolic heart failure   Atrial fibrillation   Essential hypertension, benign   Nausea and vomiting   Pancreatitis, acute   Hypotension, unspecified   Time spent 15 minutes

## 2013-07-11 ENCOUNTER — Encounter (HOSPITAL_COMMUNITY): Payer: Self-pay | Admitting: Family Medicine

## 2013-07-11 DIAGNOSIS — I513 Intracardiac thrombosis, not elsewhere classified: Secondary | ICD-10-CM

## 2013-07-11 DIAGNOSIS — N189 Chronic kidney disease, unspecified: Secondary | ICD-10-CM

## 2013-07-11 DIAGNOSIS — I059 Rheumatic mitral valve disease, unspecified: Secondary | ICD-10-CM

## 2013-07-11 DIAGNOSIS — I5189 Other ill-defined heart diseases: Secondary | ICD-10-CM

## 2013-07-11 HISTORY — DX: Intracardiac thrombosis, not elsewhere classified: I51.3

## 2013-07-11 LAB — BASIC METABOLIC PANEL
Calcium: 9.4 mg/dL (ref 8.4–10.5)
Chloride: 99 mEq/L (ref 96–112)
GFR calc non Af Amer: 27 mL/min — ABNORMAL LOW (ref >90)
Glucose, Bld: 133 mg/dL — ABNORMAL HIGH (ref 70–99)
Potassium: 4.1 mEq/L (ref 3.5–5.1)
Sodium: 140 mEq/L (ref 135–145)

## 2013-07-11 NOTE — Progress Notes (Signed)
    Primary cardiologist: Dr. Jonelle Sidle  Subjective:   No nausea or vomiting, eating breakfast. No breathlessness or chest pain.   Objective:   Temp:  [97.3 F (36.3 C)-97.9 F (36.6 C)] 97.5 F (36.4 C) (11/17 0428) Pulse Rate:  [74-85] 76 (11/17 0428) Resp:  [20] 20 (11/17 0428) BP: (106-141)/(52-68) 141/68 mmHg (11/17 0428) SpO2:  [96 %-98 %] 97 % (11/17 0428) Weight:  [211 lb 13.8 oz (96.1 kg)] 211 lb 13.8 oz (96.1 kg) (11/17 0428) Last BM Date: 07/10/13  Filed Weights   07/09/13 0436 07/10/13 0336 07/11/13 0428  Weight: 207 lb 14.3 oz (94.3 kg) 210 lb 1.6 oz (95.3 kg) 211 lb 13.8 oz (96.1 kg)    Intake/Output Summary (Last 24 hours) at 07/11/13 1610 Last data filed at 07/11/13 0600  Gross per 24 hour  Intake 3641.25 ml  Output    600 ml  Net 3041.25 ml    Exam:  General: Obese, appears comfortable.  Lungs: Diminished breath sounds but clear.  Cardiac: Irregularly irregular, no S3.  Abdomen: NABS.  Extremities: Trace edema.  Lab Results:  Basic Metabolic Panel:  Recent Labs Lab 07/09/13 0644 07/10/13 0600 07/11/13 0638  NA 137 139 140  K 4.4 3.6 4.1  CL 96 97 99  CO2 31 34* 35*  GLUCOSE 136* 127* 133*  BUN 60* 48* 38*  CREATININE 2.83* 2.25* 1.75*  CALCIUM 9.9 9.3 9.4    Liver Function Tests:  Recent Labs Lab 07/08/13 1616  AST 26  ALT 11  ALKPHOS 113  BILITOT 1.2  PROT 7.6  ALBUMIN 3.2*    CBC:  Recent Labs Lab 07/08/13 1616 07/09/13 0644  WBC 9.8 9.1  HGB 18.5* 17.1*  HCT 55.7* 53.5*  MCV 95.4 95.7  PLT 182 196    Lipid Panel:    Component Value Date/Time   CHOL 197 07/09/2013 0644   TRIG 170* 07/09/2013 0644   HDL 47 07/09/2013 0644   CHOLHDL 4.2 07/09/2013 0644   VLDL 34 07/09/2013 0644   LDLCALC 116* 07/09/2013 0644      Medications:   Scheduled Medications: . DULoxetine  30 mg Oral Daily  . heparin  5,000 Units Subcutaneous Q8H  . metoprolol  100 mg Oral BID  . sodium chloride  3 mL  Intravenous Q12H     Infusions: . sodium chloride 50 mL/hr at 07/10/13 2206     PRN Medications:  acetaminophen, acetaminophen, alum & mag hydroxide-simeth, olopatadine, ondansetron (ZOFRAN) IV, ondansetron   Assessment:   1. Acute on chronic renal failure, creatinine down to 1.7 with hydration and improving oral intake. ACE-I and diuretics held.  2. Possible pancreatitis, per primary team.  3. Chronic combined heart failure, LVEF 45-50% as of August 2014. Followup echocardiogram pending.  4. Persistent atrial fibrillation, plan heart rate control. Amiodarone stopped on admission. Was taken off Xarelto previously due to increased bleeding risk (brusiing and falls).   Plan/Discussion:    Followup echocardiogram results. Start ASA, continue metoprolol. Hold off resuming ACE-I for now. Question remains when to resume diuretic - weight imcreasing with hydration, but suspected dehydration at presentation and creatinine is improving.   Jonelle Sidle, M.D., F.A.C.C.

## 2013-07-11 NOTE — Progress Notes (Signed)
     See note from earlier today. Echocardiogram reviewed with stable LVEF and grade 2 diastolic dysfunction. There is a probable left atrial thrombus noted, see full report. In light of this with dilated left atrium and atrial fibrillation as well as high thromboembolic risk score, would suggest another attempt at anticoagulation. Suggest stop ASA and try Eliquis 2.5 mg BID.  Jonelle Sidle, M.D., F.A.C.C.

## 2013-07-11 NOTE — Care Management Note (Signed)
    Page 1 of 1   07/12/2013     1:55:50 PM   CARE MANAGEMENT NOTE 07/12/2013  Patient:  Laurie Ramirez, Laurie Ramirez   Account Number:  [id number]  Date Initiated:  07/11/2013  Documentation initiated by:  Rosemary Holms  Subjective/Objective Assessment:   Pt lives at home with her adult child. Active with [facility] RN. request HH PT as well. Has hospital  bed and RW at home.     Action/Plan:   Anticipated DC Date:  07/12/2013   Anticipated DC Plan:  ACUTE TO ACUTE TRANS      DC Planning Services  CM consult      Choice offered to / List presented to:             Status of service:  Completed, signed off Medicare Important Message given?   (If response is "NO", the following Medicare IM given date fields will be blank) Date Medicare IM given:   Date Additional Medicare IM given:    Discharge Disposition:  ACUTE TO ACUTE TRANS  Per UR Regulation:    If discussed at Long Length of Stay Meetings, dates discussed:    Comments:  07/11/13 Rosemary Holms RN BSN CM

## 2013-07-11 NOTE — Progress Notes (Signed)
*  PRELIMINARY RESULTS* Echocardiogram 2D Echocardiogram has been performed.  Laurie Ramirez 07/11/2013, 8:06 AM

## 2013-07-11 NOTE — Progress Notes (Signed)
[facility] PROGRESS NOTE  Laurie Ramirez:[id number] DOB: [birth year] DOA: 07/08/2013 PCP: Colette Ribas, MD  Assessment/Plan: 1. Probable atrial thrombus: Management per cardiology. Eliquis started. 2. Nausea and vomiting: Resolved. Pancreatitis suspected.  3. Possible acute pancreatitis: Asymptomatic. Triglycerides with minimal elevation. May have been medication induced. 4. Acute renal failure: Continues to improve. Secondary to poor oral intake, continue diuretic therapy, ACE inhibitor and vomiting.  5. Chronic systolic/diastolic congestive heart failure: Left ventricular ejection fraction 03/2013:45-50%. Indeterminate diastolic function. Appears stable. Continue to monitor volume status daily. 6. Chronic atrial fibrillation: stable. Not anticoagulated secondary to severe bruising. CHADSVASC score is 5-6. Discontinue amiodarone per cardiology.  Continue rate control. 7. Polycythemia: Present since at least August 2014. Followup as an outpatient.   Repeat basic metabolic panel in the morning. Continue IV fluids. Monitor volume status closely.  Eliquis per cardiology  Pending studies:   none  Code Status: full code DVT prophylaxis: heparin Family Communication: None present Disposition Plan: home when improved  Laurie Sacks, MD  [facility]  Pager [phone] If 7PM-7AM, please contact night-coverage at [email or web address], password [facility] 07/11/2013, 6:15 PM  LOS: 3 days   Summary: 79 year old woman with history of chronic systolic heart failure and atrial fibrillation was seen by her cardiologist in the outpatient setting today for followup where she was found to be mildly hypotensive and reported history of vomiting and poor oral intake.she suspected to have acute on chronic renal failure causing hypotension it was felt she would benefit from admission for further evaluation and treatment.  Consultants:  none  Procedures:  2-D echocardiogram: Left  ventricular ejection fraction 45-50%. Diffuse hypokinesis. Grade 2 diastolic dysfunction. Probable thrombus within the left atrium.  Antibiotics:    HPI/Subjective: Continues to feel better. No nausea vomiting or pelvic pain. She is eating well. No complaints.  Objective: Filed Vitals:   07/10/13 1300 07/10/13 2157 07/11/13 0428 07/11/13 1452  BP: 110/60 113/61 141/68 102/56  Pulse: 85 74 76 73  Temp: 97.9 F (36.6 C) 97.3 F (36.3 C) 97.5 F (36.4 C) 97.4 F (36.3 C)  TempSrc: Oral Oral Oral Oral  Resp: 20 20 20 20   Height:      Weight:   96.1 kg (211 lb 13.8 oz)   SpO2: 96% 98% 97% 94%    Intake/Output Summary (Last 24 hours) at 07/11/13 1815 Last data filed at 07/11/13 1810  Gross per 24 hour  Intake 4661.25 ml  Output   1200 ml  Net 3461.25 ml     Filed Weights   07/09/13 0436 07/10/13 0336 07/11/13 0428  Weight: 94.3 kg (207 lb 14.3 oz) 95.3 kg (210 lb 1.6 oz) 96.1 kg (211 lb 13.8 oz)    Exam:   Afebrile, vital signs stable.   General: Appears calm and comfortable. Speech fluent and clear.  Cardiovascular: Regular rate and rhythm. No murmur, rub or gallop.  Respiratory: Clear to auscultation bilaterally. No wheezes, rales or rhonchi. Normal respiratory effort.  Abdomen: Soft, nontender, nondistended.   Data Reviewed:  Creatinine turning downwards, 1.75 today. BUN continues to trend downwards.  2-D echocardiogram noted  Scheduled Meds: . DULoxetine  30 mg Oral Daily  . heparin  5,000 Units Subcutaneous Q8H  . metoprolol  100 mg Oral BID  . sodium chloride  3 mL Intravenous Q12H   Continuous Infusions: . sodium chloride 50 mL/hr at 07/10/13 2206    Principal Problem:   Acute renal failure Active Problems:   Chronic systolic heart failure   Atrial  fibrillation   Essential hypertension, benign   Nausea and vomiting   Pancreatitis, acute   Hypotension, unspecified   Time spent 15 minutes

## 2013-07-12 ENCOUNTER — Inpatient Hospital Stay (HOSPITAL_COMMUNITY): Payer: Medicare Other

## 2013-07-12 DIAGNOSIS — M6281 Muscle weakness (generalized): Secondary | ICD-10-CM

## 2013-07-12 DIAGNOSIS — I10 Essential (primary) hypertension: Secondary | ICD-10-CM

## 2013-07-12 DIAGNOSIS — I639 Cerebral infarction, unspecified: Secondary | ICD-10-CM

## 2013-07-12 DIAGNOSIS — I6350 Cerebral infarction due to unspecified occlusion or stenosis of unspecified cerebral artery: Secondary | ICD-10-CM

## 2013-07-12 DIAGNOSIS — R4701 Aphasia: Secondary | ICD-10-CM

## 2013-07-12 LAB — LIPID PANEL
Cholesterol: 206 mg/dL — ABNORMAL HIGH (ref 0–200)
LDL Cholesterol: 131 mg/dL — ABNORMAL HIGH (ref 0–99)
Total CHOL/HDL Ratio: 5.9 RATIO
Triglycerides: 201 mg/dL — ABNORMAL HIGH (ref <150)
VLDL: 40 mg/dL (ref 0–40)

## 2013-07-12 LAB — HEMOGLOBIN A1C: Hgb A1c MFr Bld: 6.7 % — ABNORMAL HIGH (ref <5.7)

## 2013-07-12 LAB — MRSA PCR SCREENING: MRSA by PCR: NEGATIVE

## 2013-07-12 LAB — GLUCOSE, CAPILLARY: Glucose-Capillary: 164 mg/dL — ABNORMAL HIGH (ref 70–99)

## 2013-07-12 MED ORDER — LABETALOL HCL 5 MG/ML IV SOLN: 10.0000 mg | INTRAVENOUS | Status: DC | PRN

## 2013-07-12 MED ORDER — SODIUM CHLORIDE 0.9 % IV SOLN
INTRAVENOUS | Status: DC
  Administered 2013-07-12 – 2013-07-16 (×5): via INTRAVENOUS

## 2013-07-12 MED ORDER — FUROSEMIDE 10 MG/ML IJ SOLN
20.0000 mg | Freq: Once | INTRAMUSCULAR | Status: AC
  Administered 2013-07-12: 20 mg via INTRAVENOUS
  Filled 2013-07-12: qty 2

## 2013-07-12 MED ORDER — ASPIRIN 300 MG RE SUPP
300.0000 mg | Freq: Every day | RECTAL | Status: DC
  Administered 2013-07-13 – 2013-07-14 (×2): 300 mg via RECTAL
  Filled 2013-07-12 (×4): qty 1

## 2013-07-12 MED ORDER — ALTEPLASE (STROKE) FULL DOSE INFUSION
0.9000 mg/kg | Freq: Once | INTRAVENOUS | Status: AC
  Administered 2013-07-12: 86 mg via INTRAVENOUS
  Filled 2013-07-12: qty 86

## 2013-07-12 MED ORDER — ALTEPLASE (STROKE) FULL DOSE INFUSION: 0.9000 mg/kg | Freq: Once | INTRAVENOUS | Status: DC

## 2013-07-12 MED ORDER — ACETAMINOPHEN 325 MG PO TABS: 650.0000 mg | ORAL_TABLET | ORAL | Status: DC | PRN

## 2013-07-12 MED ORDER — SODIUM CHLORIDE 0.9 % IV SOLN: 250.0000 mL | Freq: Once | INTRAVENOUS | Status: DC

## 2013-07-12 MED ORDER — STROKE: EARLY STAGES OF RECOVERY BOOK
Freq: Once | Status: AC
  Administered 2013-07-12: 03:00:00
  Filled 2013-07-12: qty 1

## 2013-07-12 MED ORDER — ACETAMINOPHEN 650 MG RE SUPP: 650.0000 mg | RECTAL | Status: DC | PRN

## 2013-07-12 MED ORDER — PANTOPRAZOLE SODIUM 40 MG IV SOLR
40.0000 mg | Freq: Every day | INTRAVENOUS | Status: DC
  Administered 2013-07-12 – 2013-07-14 (×3): 40 mg via INTRAVENOUS
  Filled 2013-07-12 (×4): qty 40

## 2013-07-12 MED ORDER — ALTEPLASE 100 MG IV SOLR
INTRAVENOUS | Status: AC
  Filled 2013-07-12: qty 100

## 2013-07-12 NOTE — Progress Notes (Signed)
Stroke Team Progress Note  HISTORY DENELDA AKERLEY is a 79 y.o. female with a history of Afib who was not on anticoagulation due to bruising who presented to [facility] on 11/14 with nausea and vomiting. She was being treated for presumed pancreatitis and renal failure felt to be due to nausea and vomiting. Her creatinine at baseline is around 1.15 and was 3.16 on admission. Currently trending down to 1.75 this morning.   She was seen by nursing at 9:30pm and at that time appeared to be in her normal state talking and moving her right side.  Of note, there is discussion of starting apixaban, but there is no documentation of a dose given and therefore t-PA was given. TPA was started at 1:49 AM. Patient transferred from [facility].  Echo had shown possible apical thrombus. Patient in afib (not on anticoagulation due to bruising). Eliquis contemplated; however, had not started.   SUBJECTIVE She is alone in room, lying in bed. aphasic.  Overall  her condition is unchanged. Gesturing.    OBJECTIVE Most recent Vital Signs: Filed Vitals:   07/12/13 0530 07/12/13 0600 07/12/13 0630 07/12/13 0700  BP: 147/83 97/77 136/73 156/82  Pulse:    43  Temp:      TempSrc:      Resp: 16 18 18 18   Height:      Weight:      SpO2:    95%   CBG (last 3)   Recent Labs  07/12/13 0009  GLUCAP 164*    IV Fluid Intake:   . sodium chloride 50 mL/hr at 07/12/13 0305    MEDICATIONS  . sodium chloride  250 mL Intravenous Once  . pantoprazole (PROTONIX) IV  40 mg Intravenous QHS   PRN:  acetaminophen, acetaminophen, labetalol  Diet:  NPO  Activity:  Bedrest DVT Prophylaxis:  SCD  CLINICALLY SIGNIFICANT STUDIES Basic Metabolic Panel:  Recent Labs Lab 07/10/13 0600 07/11/13 0638  NA 139 140  K 3.6 4.1  CL 97 99  CO2 34* 35*  GLUCOSE 127* 133*  BUN 48* 38*  CREATININE 2.25* 1.75*  CALCIUM 9.3 9.4   Liver Function Tests:  Recent Labs Lab 07/08/13 1616  AST 26  ALT 11  ALKPHOS 113   BILITOT 1.2  PROT 7.6  ALBUMIN 3.2*   CBC:  Recent Labs Lab 07/08/13 1616 07/09/13 0644  WBC 9.8 9.1  HGB 18.5* 17.1*  HCT 55.7* 53.5*  MCV 95.4 95.7  PLT 182 196   Coagulation: No results found for this basename: LABPROT, INR,  in the last 168 hours Cardiac Enzymes: No results found for this basename: CKTOTAL, CKMB, CKMBINDEX, TROPONINI,  in the last 168 hours Urinalysis:  Recent Labs Lab 07/09/13 0242  COLORURINE YELLOW  LABSPEC 1.020  PHURINE 5.5  GLUCOSEU NEGATIVE  HGBUR MODERATE*  BILIRUBINUR NEGATIVE  KETONESUR NEGATIVE  PROTEINUR NEGATIVE  UROBILINOGEN 0.2  NITRITE NEGATIVE  LEUKOCYTESUR TRACE*   Lipid Panel    Component Value Date/Time   CHOL 206* 07/12/2013 0440   TRIG 201* 07/12/2013 0440   HDL 35* 07/12/2013 0440   CHOLHDL 5.9 07/12/2013 0440   VLDL 40 07/12/2013 0440   LDLCALC 131* 07/12/2013 0440   HgbA1C  No results found for this basename: HGBA1C    Urine Drug Screen:   No results found for this basename: labopia, cocainscrnur, labbenz, amphetmu, thcu, labbarb    Alcohol Level: No results found for this basename: ETH,  in the last 168 hours  Ct Head Wo  Contrast 07/12/2013  : 1. Periventricular white matter and corona radiata hypodensities favor chronic ischemic microvascular white matter disease. No definite acute intracranial findings.    MRI of the brain    MRA of the brain    2D Echocardiogram  EF 45%, LA with mobile echodensity c/w myoxma (new from 03/2013) which may be more suggestive of thrombus as is appears to be affixed to the atrial septum. LA mod-severe dilated.  Carotid Doppler    CXR    EKG  atrial fibrillation, rate 70s.   Therapy Recommendations   GENERAL EXAM: Patient is in no distress  CARDIOVASCULAR: IRREG RATE AND RHYTHM. No murmurs, no carotid bruits. NO SUPRACLAVICULAR BRUITS. DECR RADIAL PULSES BILATERALLY.  NEUROLOGIC: MENTAL STATUS: awake; LEFT GAZE PREFERENCE. RIGHT NEGLECT. MUTE. FOLLOWS SIMPLE  COMMANDS (STICK TONGUE, HOLD UP ARM).  CRANIAL NERVE: pupils equal and reactive to light, BLINKS TO THREAT ON LEFT, NO BLINK TO THREAT ON RIGHT. LEFT GAZE DEVIATION, WITH DOLL'S EYES REFLEX PRESENT TO MIDLINE. No nystagmus, DECR RIGHT FACIAL STRENGTH.  MOTOR: RUE AND RLE 0/5. MINIMAL WITHDRAWAL IN RLE TO STIM. LUE 3-4/5, LLE 3/5.  SENSORY: WITHDRAWS ON LUE, LLE, RLE.  GAIT/STATION: IN BED.    ASSESSMENT Ms. LANEY LOUDERBACK is a 79 y.o. female presenting with right hemiparesis. Status post IV t-PA 07/12/2013 at 0149. CT imaging shows chronic ischemic microvascular white matter disease. MR Imaging pending. Infarct felt to be embolic secondary to afib, apical thrombus.  On aspirin 81 mg orally every day prior to admission. Now on no antithrombotics (s/p tPA-awaiting f/u films) for secondary stroke prevention. Patient with resultant right hemiparesis. Work up underway.   Hyperlipidemia, LDL 131, goal  Abnormal urine  Apical thrombus, eliquis recommended per cardiology  Atrial fibrillation  Chronic systolic heart failure  hypertension  Hospital day # 4  TREATMENT/PLAN  Continue no antithrombotics (s/p tPA-await f/u imaging to start antithrombotics) for secondary stroke prevention.  Await swallow study  Will need statin when able to swallow  Urinalysis with culture/hgb a1c/carotids/MR/CT pending.  CT Scan later today, MRI tomorrow as it is not felt that patient can tolerate MRI today.  If CT negative for bleed, start aspirin today and will switch to eliquis probably in am if able to swallow due to apical thrombus.  Gwendolyn Lima. Manson Passey, [facility], MBA, MHA [facility] Pager: [phone] 07/12/2013 7:41 AM  I evaluated and examined patient, reviewed records, labs and imaging, and agree with note and plan. CT tonight, aspirin if negative for ICH, then will consider starting eliquis tomorrow, based on exam and size of infarct and ability to swallow.  Suanne Marker, MD  07/12/2013, 10:03 AM Certified in Neurology, Neurophysiology and Neuroimaging [facility] - Stroke Team  Please refer to [email or web address] for on-call Stroke MD

## 2013-07-12 NOTE — Progress Notes (Addendum)
79 yo female admitted for acute renal failure, n/v secondary to VGE vs pancreatitis, was in afib w rvr old rbbb improved with holding bp meds and diuretics and with ivf.  Pt lives at home with her daughter.  approx 1210am received phone call from RN that pt was unable to use right side of body and was not talking or responding to her as she was previously in night.  Ordered stat CT scan head s contrast.  After reviewing chart, echo done today showed probable atrial thrombus, eliquis recommended by cardiology service but cannot verify if patient ever received via reviewing med rec in epic (pt previously was not anticoagulated due to freq falls prior to admission).  Ct scan showed no acute bleed.  At 1am on initial assessment of pt PE:  alert, awake in NAD no drooling no facial drooping.   Irregular rhythm no r/r/g cta bilaterally Soft, ntnd pos bs no r/g No c/c/e rigth sided hemiparesis with right sided neglect 0/5 strength rue and rle, 4/5 lue and lle pt aphasic not able to follow simple commands c/w acute cva  Initiated code stroke thru [facility].  Spoke to dr [facility] [employer] via phone at [facility], discussed case.  Pt last seen normal approximately 930pm when RN helped her to bedside commode.  Within tpa window.  Recommended tpa protocol.  Pt was moved to icu quickly in order to administer tpa which was started approximately 150am.  EMS was called for transfer felt to be quicker than going through [facility].  Pt transferred to [facility] 3 M.    Attempted to call daughter listed in chart that lives with pt.  Name is melody joyce phone number was incorrect in chart with [phone] instead of [phone], this has been corrected by myself in epic.  Went straight to voicemail no answer.  Attempted to call step daughter who is also listed but could not be reached.  Have called the county sheriff dept approx 230am to assist in going to daughter melody address to call dr Amada Jupiter as soon as possible, his phone  number given to deputy to give to ms melody as her consent will be needed for further intervention at [facility] by dr Amada Jupiter.  Pt left with ems around 215 am in stable condition with tpa infusion going.   approx 2 hours critical care time

## 2013-07-12 NOTE — Progress Notes (Signed)
Patient back to floor from CT. Contacted hospitalist to inform her that the CT was complete.

## 2013-07-12 NOTE — Progress Notes (Signed)
Patient has a change in mental status. Awake but not responsive to questions. Vital signs are: 169/85; hr-85; O2 sat 97% on room air. Contacted hospitalist on call. Received order to get stat head CT w/o contrast. Will continue to monitor closely.

## 2013-07-12 NOTE — Progress Notes (Signed)
Contact nurse on unit 3100 at [facility] to give report on the patient.

## 2013-07-12 NOTE — Progress Notes (Signed)
Hospitalist arrived to the floor to see patient.

## 2013-07-12 NOTE — Progress Notes (Signed)
[facility]  Patient Status: Active (receiving services up to time of hospitalization)  [facility] is providing the following services: RN, PT and MSW  If patient discharges after hours, please call [phone].   Laurie Ramirez 07/12/2013, 9:48 AM

## 2013-07-12 NOTE — Evaluation (Signed)
Clinical/Bedside Swallow Evaluation Patient Details  Name: Laurie Ramirez MRN: [id number] Date of Birth: 07-16-1932  Today's Date: 07/12/2013 Time: 0900-0930 SLP Time Calculation (min): 30 min  Past Medical History:  Past Medical History  Diagnosis Date  . Essential hypertension, benign   . Atrial fibrillation   . Bronchitis   . Depression   . Chronic combined systolic and diastolic heart failure      LVEF 45-50%  . 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 Chronic systolic heart failure 06/18/2013   Past Surgical History:  Past Surgical History  Procedure Laterality Date  . Shoulder surgery      right   HPI:  79 year old female with a history of CHF, A. fib, presented to [facility] on 11/14 with nausea and vomiting. She was being treated for presumed pancreatitis and renal failure felt to be due to nausea and vomiting.  She was seen by nursing at 9:30pm and at that time appeared to be in her normal state talking and moving her right side. Possible apical thrombus on echo with signs and symptoms of a large left MCA infarct. She has received IV TPA.   Assessment / Plan / Recommendation Clinical Impression  Pt demonstrates overt evidence of decreased airway protection with thin liquids with suspected delayed swallow response. There are inconsistent findings of intolerance of nectar thick liquids as well. Pt able to consume bites of puree without significant difficulty. Pt may take meds in puree if necessary but suggest pt remain NPO until reassessment tomorrow as she is still on bedrest following tPA. May need MBS tomrorow is difficulty persists.     Aspiration Risk  Moderate    Diet Recommendation NPO except meds   Medication Administration: Crushed with puree    Other  Recommendations Oral Care Recommendations: Oral care Q4 per protocol Other Recommendations: Have oral suction available   Follow Up Recommendations       Frequency and Duration min 2x/week  2 weeks   Pertinent  Vitals/Pain NA    SLP Swallow Goals     Swallow Study Prior Functional Status       General HPI: 79 year old female with a history of CHF, A. fib, presented to [facility] on 11/14 with nausea and vomiting. She was being treated for presumed pancreatitis and renal failure felt to be due to nausea and vomiting.  She was seen by nursing at 9:30pm and at that time appeared to be in her normal state talking and moving her right side. Possible apical thrombus on echo with signs and symptoms of a large left MCA infarct. She has received IV TPA. Type of Study: Bedside swallow evaluation Diet Prior to this Study: NPO Temperature Spikes Noted: No Respiratory Status: Nasal cannula History of Recent Intubation: No Behavior/Cognition: Alert;Doesn't follow directions Oral Cavity - Dentition: Dentures, top;Dentures, bottom (removed dentition) Self-Feeding Abilities: Total assist Patient Positioning: Upright in bed Baseline Vocal Quality:  (does not phonate) Volitional Cough: Cognitively unable to elicit Volitional Swallow: Unable to elicit    Oral/Motor/Sensory Function Overall Oral Motor/Sensory Function:  (does not follow commands, ) Labial ROM: Reduced right Labial Symmetry: Abnormal symmetry right Labial Strength: Within Functional Limits Lingual ROM:  (UTA) Facial ROM: Reduced right Facial Symmetry: Right droop   Ice Chips Ice chips: Impaired Presentation: Spoon Pharyngeal Phase Impairments: Suspected delayed Swallow   Thin Liquid Thin Liquid: Impaired Presentation: Cup;Straw Oral Phase Functional Implications: Prolonged oral transit Pharyngeal  Phase Impairments: Suspected delayed Swallow;Cough - Immediate;Cough - Delayed    Nectar Thick  Nectar Thick Liquid: Impaired Presentation: Straw;Cup Pharyngeal Phase Impairments: Cough - Delayed;Suspected delayed Swallow   Honey Thick Honey Thick Liquid: Not tested   Puree Puree: Impaired Presentation: Spoon Pharyngeal Phase Impairments:  Suspected delayed Swallow   Solid   GO    Solid: Not tested      Harlon Ditty, MA CCC-SLP [phone]  Claudine Mouton 07/12/2013,9:42 AM

## 2013-07-12 NOTE — Progress Notes (Signed)
Pt arrived via EMS. TPA infusing

## 2013-07-12 NOTE — H&P (Addendum)
Neurology H&P  CC: right sided weakness and aphasia  History is obtained from: Patient   HPI: Laurie Ramirez is a 79 y.o. female with a history of Afib who was not on anticoagulation due to bruising who presented to [facility] on 11/14 with nausea and vomiting. She was being treated for presumed pancreatitis and renal failure felt to be due to nausea and vomiting. Her creatinine at baseline is around 1.15 and was 3.16 on admission. Currently trending down to 1.75 this morning.   She was seen by nursing at 9:30pm and at that time appeared to be in her normal state talking and moving her right side.   Of note, there is discussion of starting apixaban, but there is no documentation of a dose given and therefore t-PA was given. TPA was started at 1:49 AM   LKW: 11/17 9:30pm tpa given?: yes.  NIHSS: 22  ROS: Unable to assess secondary to patient's altered mental status.   Past Medical History  Diagnosis Date  . Essential hypertension, benign   . Atrial fibrillation   . Bronchitis   . Depression   . Chronic combined systolic and diastolic heart failure      LVEF 45-50%  . 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 Chronic systolic heart failure 06/18/2013    Family History: Unable to assess secondary to patient's altered mental status.    Social History: Tob: Unable to assess secondary to patient's altered mental status.   Exam: Current vital signs: BP 132/68  Pulse 49  Temp(Src) 97.4 F (36.3 C) (Oral)  Resp 17  Ht 5\' 4"  (1.626 m)  Wt 97.6 kg (215 lb 2.7 oz)  BMI 36.92 kg/m2  SpO2 96% Vital signs in last 24 hours: Temp:  [97.4 F (36.3 C)-98.3 F (36.8 C)] 97.4 F (36.3 C) (11/18 0240) Pulse Rate:  [49-104] 49 (11/18 0240) Resp:  [17-22] 17 (11/18 0240) BP: (102-169)/(56-101) 132/68 mmHg (11/18 0240) SpO2:  [91 %-97 %] 96 % (11/18 0240) Weight:  [96.1 kg (211 lb 13.8 oz)-97.6 kg (215 lb 2.7 oz)] 97.6 kg (215 lb 2.7 oz) (11/18 0240)  General: in bed,  CV: Regular rate and  rhythm Abd: NT, ND Resp: non-labored breathing.  Ext: trace edema Mental Status: Patient is awake, alert, engages examiner from the left.  She does not have any speech or follow any commands.  Cranial Nerves: II: Does not blink to threat from the right. Pupils are equal, round, and reactive to light.   III,IV, VI: left gaze deviation, does not cross midline to the right.  V: reacts to stim on right face VII: Facial movement is notable for right face droop VIII, X, XI, XII: Unable to assess secondary to patient's altered mental status.  Motor:  Tone is flaccid on right, normal on left, moves left side well. No voluntary movement on right seen, minimal withdrawal in RLE and extension in RUE to noxious stimuli.  Sensory: Sensation is diminished on right, but does respond Cerebellar: Unable to assess secondary to patient's altered mental status.  Gait: Unable to assess secondary to patient's altered mental status.   I have reviewed labs in epic and the results pertinent to this consultation are: Cr 1.75 this am  Glucose 164  I have reviewed the images obtained:CT head - negative  Impression: 79 year old female with a history of CHF, A. fib, possible apical thrombus on echo with signs and symptoms of a large left MCA infarct. She has received IV TPA.  Recommendations: 1. HgbA1c, fasting  lipid panel 2. MRI, MRA  of the brain without contrast 3. Frequent neuro checks 4. Echocardiogram is not needed as was performed yesterday 5. Carotid dopplers 6. Prophylactic therapy-None, tpa 7. Risk factor modification 8. Telemetry monitoring 9. PT consult, OT consult, Speech consult 10. Hol danti-hypertensives, though if rate becomes issue, may need to involve cardiology   This patient is critically ill and at significant risk of neurological worsening, death and care requires constant monitoring of vital signs, hemodynamics,respiratory and cardiac monitoring, neurological assessment,  discussion with family, other specialists and medical decision making of high complexity. I spent 60 minutes of neurocritical care time  in the care of  this patient.  Ritta Slot, MD [facility] [phone]  If 7pm- 7am, please page neurology on call at [phone].  07/12/2013  4:14 AM

## 2013-07-12 NOTE — Progress Notes (Signed)
Patient ID: Laurie Ramirez, female   DOB: December 20, 1931, 79 y.o.   MRN: [id number]    Primary cardiologist: Dr. Jonelle Sidle  Subjective:   No complaints Resting this am No chest pain palpitations   Objective:   Temp:  [97.4 F (36.3 C)-98.3 F (36.8 C)] 97.7 F (36.5 C) (11/18 0730) Pulse Rate:  [43-104] 69 (11/18 0800) Resp:  [15-24] 19 (11/18 0800) BP: (97-169)/(56-101) 148/75 mmHg (11/18 0800) SpO2:  [91 %-100 %] 98 % (11/18 0800) Weight:  [215 lb 2.7 oz (97.6 kg)] 215 lb 2.7 oz (97.6 kg) (11/18 0240) Last BM Date: 07/10/13  Filed Weights   07/10/13 0336 07/11/13 0428 07/12/13 0240  Weight: 210 lb 1.6 oz (95.3 kg) 211 lb 13.8 oz (96.1 kg) 215 lb 2.7 oz (97.6 kg)    Intake/Output Summary (Last 24 hours) at 07/12/13 0859 Last data filed at 07/12/13 0800  Gross per 24 hour  Intake 1599.16 ml  Output    600 ml  Net 999.16 ml    Exam:  General: Obese, appears comfortable. Hirtutism   Lungs: Diminished breath sounds but clear.  Cardiac: Irregularly irregular, no S3.  Abdomen: NABS.  Extremities: Trace edema.  Lab Results:  Basic Metabolic Panel:  Recent Labs Lab 07/09/13 0644 07/10/13 0600 07/11/13 0638  NA 137 139 140  K 4.4 3.6 4.1  CL 96 97 99  CO2 31 34* 35*  GLUCOSE 136* 127* 133*  BUN 60* 48* 38*  CREATININE 2.83* 2.25* 1.75*  CALCIUM 9.9 9.3 9.4    Liver Function Tests:  Recent Labs Lab 07/08/13 1616  AST 26  ALT 11  ALKPHOS 113  BILITOT 1.2  PROT 7.6  ALBUMIN 3.2*    CBC:  Recent Labs Lab 07/08/13 1616 07/09/13 0644  WBC 9.8 9.1  HGB 18.5* 17.1*  HCT 55.7* 53.5*  MCV 95.4 95.7  PLT 182 196    Lipid Panel:    Component Value Date/Time   CHOL 206* 07/12/2013 0440   TRIG 201* 07/12/2013 0440   HDL 35* 07/12/2013 0440   CHOLHDL 5.9 07/12/2013 0440   VLDL 40 07/12/2013 0440   LDLCALC 131* 07/12/2013 0440      Medications:   Scheduled Medications: . sodium chloride  250 mL Intravenous Once  . pantoprazole  (PROTONIX) IV  40 mg Intravenous QHS    Infusions: . sodium chloride 50 mL/hr at 07/12/13 0800    PRN Medications: acetaminophen, acetaminophen, labetalol   Assessment:   1. Acute on chronic renal failure, creatinine down to 1.75  with hydration and improving oral intake. ACE-I and diuretics held.  2. Possible pancreatitis, per primary team.  3. Chronic combined heart failure, LVEF 45-50% 11/17 echo no change from August.   4. Persistent atrial fibrillation, plan heart rate control. Amiodarone stopped on admission. Rate ok Eliquis 2.5 bid per Dr Diona Browner   Plan/Discussion:    Weight up resume 20 mg lasix daily Home dose was 40 mg  Start eliquis 2.5 bid when ok with neurology post tPa  Charlton Haws

## 2013-07-12 NOTE — Progress Notes (Signed)
*  PRELIMINARY RESULTS* Vascular Ultrasound Carotid Duplex (Doppler) has been completed.  Preliminary findings: Technically difficult due to patient movement. Right = 1-39% ICA stenosis. Left = Apparent occlusion of Common carotid artery from thrombus. Limited flow noted in external carotid, no flow noted in internal carotid.  Antegrade vertebral flow.    Farrel Demark, RDMS, RVT  07/12/2013, 11:46 AM

## 2013-07-12 NOTE — Clinical Documentation Improvement (Signed)
THIS DOCUMENT IS NOT A PERMANENT PART OF THE MEDICAL RECORD  Please update your documentation with the medical record to reflect your response to this query. If you need help knowing how to do this please call [phone].  07/12/13   Dear Dr. Sethi/Kirkpatrick[facility]Associates,  A review of the patient medical record has revealed the following indicators.  Based on your clinical judgment, please clarify and document in a progress note and/or discharge summary the clinical condition associated with the following supporting information:  Patient admitted to [facility] with Acute Renal Failure Per H&P @ [facility]: suspected to have acute on chronic renal failure  Baseline CR 1.15  Labs: BUN/CR/GFR 10/28 = 28/1.16/44 11/3 = 48/2.42/18 11/14 = 63/3.16/13 11/15 = 60/2.83/15 11/16 = 48/2.25/20 11/17 = 38/1.175/27  Treatment: Monitoring I&O Monitoring BMP  Please clarify. Thank you Possible Clinical Conditions?   CKD Stage I -  GFR > OR = 90 CKD Stage II - GFR 60-80 CKD Stage III - GFR 30-59 CKD Stage IV - GFR 15-29 CKD Stage V - GFR < 15 ESRD (End Stage Renal Disease) Other condition_____________ Cannot Clinically determine    You may use possible, probable, or suspect with inpatient documentation. possible, probable, suspected diagnoses MUST be documented at the time of discharge  Reviewed: additional documentation in the medical record   Thank Angelene Giovanni  Clinical Documentation Specialist: [phone] Health Information Management [place]

## 2013-07-12 NOTE — Progress Notes (Signed)
Pt arrived to the ICU floor accompanied by Bethena Midget and Dr. Onalee Hua and put on the monitor at 0138 am. Pt exbitis signs of stroke, but vital signs remain stable. Per pt's nurse, CT scan was done and there was no active bleeding found. The order received to give Alteplase (86mg  total), bolus 8.6mg  first. Olegario Messier, [facility] gave the medication per Dr. Onalee Hua order. The administration dose was verified by  2nd nurse, Dorothy. Pt's condition remains unchangeable. [facility] is at the bedside and ready to transfer pt to [facility]. Family is not present at this time.

## 2013-07-12 NOTE — Progress Notes (Signed)
TPA complete  

## 2013-07-13 MED ORDER — DILTIAZEM HCL 100 MG IV SOLR
5.0000 mg/h | INTRAVENOUS | Status: DC
  Administered 2013-07-13: 10 mg/h via INTRAVENOUS
  Administered 2013-07-13: 5 mg/h via INTRAVENOUS
  Administered 2013-07-13: 15 mg/h via INTRAVENOUS
  Administered 2013-07-13: 5 mg/h via INTRAVENOUS
  Administered 2013-07-14 – 2013-07-15 (×4): 15 mg/h via INTRAVENOUS
  Filled 2013-07-13 (×8): qty 100

## 2013-07-13 MED ORDER — METOPROLOL TARTRATE 1 MG/ML IV SOLN
5.0000 mg | Freq: Once | INTRAVENOUS | Status: AC
  Administered 2013-07-13: 5 mg via INTRAVENOUS
  Filled 2013-07-13: qty 5

## 2013-07-13 MED ORDER — METOPROLOL TARTRATE 1 MG/ML IV SOLN
5.0000 mg | Freq: Four times a day (QID) | INTRAVENOUS | Status: DC
  Administered 2013-07-13: 5 mg via INTRAVENOUS

## 2013-07-13 MED ORDER — METOPROLOL TARTRATE 1 MG/ML IV SOLN
INTRAVENOUS | Status: AC
  Administered 2013-07-13: 5 mg via INTRAVENOUS
  Filled 2013-07-13: qty 5

## 2013-07-13 MED ORDER — DILTIAZEM LOAD VIA INFUSION
5.0000 mg | Freq: Once | INTRAVENOUS | Status: DC
  Filled 2013-07-13: qty 5

## 2013-07-13 NOTE — Progress Notes (Signed)
PT Cancellation Note  Patient Details Name: Laurie Ramirez MRN: [id number] DOB: 08/13/1932   Cancelled Treatment:    Reason Eval/Treat Not Completed: Patient not medically ready. RN deferred due to pt in afib and tachycardia. PT to return when able.   Marcene Brawn 07/13/2013, 9:43 AM

## 2013-07-13 NOTE — Progress Notes (Signed)
Speech Language Pathology Treatment: Dysphagia  Patient Details Name: Laurie Ramirez MRN: [id number] DOB: Apr 28, 1932 Today's Date: 07/13/2013 Time: 7829-5621 SLP Time Calculation (min): 16 min  Assessment / Plan / Recommendation Clinical Impression  Pt demonstrates improved responsiveness and automaticity with PO today. Pt accepted 2 oz of puree with independent 2nd swallows to clear oral residue. Honey thick liquids resulted in slight throat clearing, but pt will not phonate to evaluate for wet vocal quality. Pudding thick liquids were tolerated, but pt turned head to indicate she did not want more. At this time will recommend pt initiate a very conservative diet of Dys 1/pudding thick liquids via teaspoon. Pt is not appropriate to participate in objective testing due to heart rate. Will follow for tolerance of diet and upgrade/test as pt able.    HPI HPI: 79 year old female with a history of CHF, A. fib, presented to [facility] on 11/14 with nausea and vomiting. She was being treated for presumed pancreatitis and renal failure felt to be due to nausea and vomiting.  She was seen by nursing at 9:30pm and at that time appeared to be in her normal state talking and moving her right side. Possible apical thrombus on echo with signs and symptoms of a large left MCA infarct. She has received IV TPA.   Pertinent Vitals HR 130  SLP Plan  Continue with current plan of care    Recommendations Diet recommendations: Dysphagia 1 (puree);Pudding-thick liquid Liquids provided via: Teaspoon Medication Administration: Crushed with puree Supervision: Staff to assist with self feeding Compensations: Slow rate;Small sips/bites Postural Changes and/or Swallow Maneuvers: Seated upright 90 degrees              General recommendations: Rehab consult Oral Care Recommendations: Oral care Q4 per protocol Follow up Recommendations: Inpatient Rehab Plan: Continue with current plan of care    GO    [facility], MA CCC-SLP [phone]  Laurie Ramirez 07/13/2013, 11:15 AM

## 2013-07-13 NOTE — Progress Notes (Signed)
Stroke Team Progress Note  HISTORY Laurie Ramirez is a 79 y.o. female with a history of Afib who was not on anticoagulation due to bruising who presented to [facility] on 11/14 with nausea and vomiting. She was being treated for presumed pancreatitis and renal failure felt to be due to nausea and vomiting. Her creatinine at baseline is around 1.15 and was 3.16 on admission. Currently trending down to 1.75 this morning.   She was seen by nursing at 9:30pm and at that time appeared to be in her normal state talking and moving her right side.  Of note, there is discussion of starting apixaban, but there is no documentation of a dose given and therefore t-PA was given. TPA was started at 1:49 AM. Patient transferred from [facility].  Echo had shown possible apical thrombus. Patient in afib (not on anticoagulation due to bruising). Eliquis contemplated; however, had not started.  As per Dr. Amada Jupiter on admission: The patient received IV tpa and was transferred from [facility]. Phone numbers in chart were wrong, and subsequently once the correct number for the daughter was found, went right to voice mail. We had the sheriff try to locate the daughter for consent, but by the time I was able to get a hold of her there was not enough time to get team and then have a reasonable expectation of being able to perform a thrombectomy, so was not done.  SUBJECTIVE  patient more awake today but remains aphasic. No family in room.   OBJECTIVE Most recent Vital Signs: Filed Vitals:   07/13/13 0300 07/13/13 0400 07/13/13 0500 07/13/13 0600  BP: 152/75 136/59 135/61 127/96  Pulse: 35     Temp:  98.5 F (36.9 C)    TempSrc:  Axillary    Resp: 18 16 15 16   Height:      Weight:      SpO2: 89%      CBG (last 3)   Recent Labs  07/12/13 0009  GLUCAP 164*    IV Fluid Intake:   . sodium chloride 50 mL/hr at 07/12/13 2000    MEDICATIONS  . sodium chloride  250 mL Intravenous Once  . aspirin  300 mg  Rectal Daily  . pantoprazole (PROTONIX) IV  40 mg Intravenous QHS   PRN:  acetaminophen, acetaminophen, labetalol  Diet:  NPO except meds crushed in puree Activity:  Bedrest DVT Prophylaxis:  SCD  CLINICALLY SIGNIFICANT STUDIES Basic Metabolic Panel:   Recent Labs Lab 07/10/13 0600 07/11/13 0638  NA 139 140  K 3.6 4.1  CL 97 99  CO2 34* 35*  GLUCOSE 127* 133*  BUN 48* 38*  CREATININE 2.25* 1.75*  CALCIUM 9.3 9.4   Liver Function Tests:   Recent Labs Lab 07/08/13 1616  AST 26  ALT 11  ALKPHOS 113  BILITOT 1.2  PROT 7.6  ALBUMIN 3.2*   CBC:   Recent Labs Lab 07/08/13 1616 07/09/13 0644  WBC 9.8 9.1  HGB 18.5* 17.1*  HCT 55.7* 53.5*  MCV 95.4 95.7  PLT 182 196   Coagulation: No results found for this basename: LABPROT, INR,  in the last 168 hours Cardiac Enzymes: No results found for this basename: CKTOTAL, CKMB, CKMBINDEX, TROPONINI,  in the last 168 hours Urinalysis:   Recent Labs Lab 07/09/13 0242  COLORURINE YELLOW  LABSPEC 1.020  PHURINE 5.5  GLUCOSEU NEGATIVE  HGBUR MODERATE*  BILIRUBINUR NEGATIVE  KETONESUR NEGATIVE  PROTEINUR NEGATIVE  UROBILINOGEN 0.2  NITRITE NEGATIVE  LEUKOCYTESUR TRACE*   Lipid Panel    Component Value Date/Time   CHOL 206* 07/12/2013 0440   TRIG 201* 07/12/2013 0440   HDL 35* 07/12/2013 0440   CHOLHDL 5.9 07/12/2013 0440   VLDL 40 07/12/2013 0440   LDLCALC 131* 07/12/2013 0440   HgbA1C  Lab Results  Component Value Date   HGBA1C 6.7* 07/12/2013    Urine Drug Screen:   No results found for this basename: labopia,  cocainscrnur,  labbenz,  amphetmu,  thcu,  labbarb    Alcohol Level: No results found for this basename: ETH,  in the last 168 hours  Ct Head Wo Contrast 07/12/2013  : 1. Periventricular white matter and corona radiata hypodensities favor chronic ischemic microvascular white matter disease. No definite acute intracranial findings.   11/18/20141. Large region of hypodensity in the left MCA  distribution  compatible with MCA infarct. No hemorrhagic transformation observed.   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  Carotid Doppler   Carotid Duplex (Doppler) has been completed. Preliminary findings: Technically difficult due to patient movement.  Right = 1-39% ICA stenosis.  Left = Apparent occlusion of Common carotid artery from thrombus. Limited flow noted in external carotid, no flow noted in internal carotid  CXR    EKG  atrial fibrillation, rate 70s.   Therapy Recommendations   GENERAL EXAM: Patient is in no distress  CARDIOVASCULAR: IRREG RATE AND RHYTHM. No murmurs, no carotid bruits. NO SUPRACLAVICULAR BRUITS. DECR RADIAL PULSES BILATERALLY.  NEUROLOGIC: MENTAL STATUS: awake; LEFT GAZE PREFERENCE. RIGHT NEGLECT. MUTE. FOLLOWS SIMPLE COMMANDS (STICK TONGUE, HOLD UP ARM).  CRANIAL NERVE: pupils equal and reactive to light, BLINKS TO THREAT ON LEFT, NO BLINK TO THREAT ON RIGHT. LEFT GAZE DEVIATION, WITH DOLL'S EYES REFLEX PRESENT TO MIDLINE. No nystagmus, DECR RIGHT FACIAL STRENGTH.  MOTOR: RUE AND RLE 0/5. MINIMAL WITHDRAWAL IN RLE TO STIM. LUE 3-4/5, LLE 3/5.  SENSORY: WITHDRAWS ON LUE, LLE, RLE.  GAIT/STATION: IN BED.    ASSESSMENT Laurie Ramirez is a 79 y.o. female presenting with right hemiparesis. Status post IV t-PA 07/12/2013 at 0149. CT imaging shows chronic ischemic microvascular white matter disease, Large region of hypodensity in the left MCA distribution compatible with MCA infarct . Infarct felt to be embolic secondary to afib, apical thrombus.  On aspirin 81 mg orally every day prior to admission. Now on rectal aspirin 300mg  daily for secondary stroke prevention. Patient with resultant right hemiparesis.     Hyperlipidemia, LDL 131, goal <100  Abnormal urine, u/a requested with culture if positive  Apical thrombus, eliquis recommended per cardiology  Atrial fibrillation, now with RVR, ask cardiology to assist with control as she has been on amiodarone recently.  Chronic systolic heart failure  Hypertension  HBG A1C  6.7  Acute renal insufficiency  Carotid stenosis, left carotid occlusion  Hospital day # 5  TREATMENT/PLAN  Continue rectal aspirin for secondary stroke prevention. Will start eliquis once PEG placed if decided upon.  Remains NPO.   Add statin once able to take oral medicaions.  Risk factor modification  Bmet in am  I have contacted cardiology to assist with rate control.  Will need to talk with family regarding care including PEG/PANDA  and other care management issues.  Gwendolyn Lima. Manson Passey, [facility], MBA, MHA [facility] Pager: [phone] 07/13/2013 7:41 AM  I have personally obtained a history, examined the patient, evaluated imaging results, and formulated the assessment and plan of care. I agree with the above. Delia Heady, MD

## 2013-07-13 NOTE — Progress Notes (Signed)
OT Cancellation Note  Patient Details Name: Laurie Ramirez MRN: [id number] DOB: February 10, 1932   Cancelled Treatment:    Reason Eval/Treat Not Completed: Medical issues which prohibited therapy (pt in afib/tachy, per RN.) Will continue to follow.  Evern Bio 07/13/2013, 10:43 AM

## 2013-07-13 NOTE — Evaluation (Signed)
Speech Language Pathology Evaluation Patient Details Name: Laurie Ramirez MRN: [id number] DOB: 1931-08-27 Today's Date: 07/13/2013 Time: 4098-1191 SLP Time Calculation (min): 16 min  Problem List:  Patient Active Problem List   Diagnosis Date Noted  . CVA (cerebral infarction) 07/12/2013  . 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 Essential hypertension, benign 07/08/2013  . Acute on chronic renal failure 07/08/2013  . Acute renal failure 07/08/2013  . Nausea and vomiting 07/08/2013  . Pancreatitis, acute 07/08/2013  . Hypotension, unspecified 07/08/2013  . Chronic systolic heart failure 06/18/2013  . Atrial fibrillation 06/18/2013   Past Medical History:  Past Medical History  Diagnosis Date  . Essential hypertension, benign   . Atrial fibrillation   . Bronchitis   . Depression   . Chronic combined systolic and diastolic heart failure      LVEF 45-50%  . 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 Chronic systolic heart failure 06/18/2013   Past Surgical History:  Past Surgical History  Procedure Laterality Date  . Shoulder surgery      right   HPI:  79 year old female with a history of CHF, A. fib, presented to [facility] on 11/14 with nausea and vomiting. She was being treated for presumed pancreatitis and renal failure felt to be due to nausea and vomiting.  She was seen by nursing at 9:30pm and at that time appeared to be in her normal state talking and moving her right side. Possible apical thrombus on echo with signs and symptoms of a large left MCA infarct. She has received IV TPA.   Assessment / Plan / Recommendation Clinical Impression  Cognitive lingusitic function severely impaired with expressive/receptive aphasia. Pt with no expressive attempts to communicate despite max cues, Does not follow exclusively verbal commands. Followed laess than 50% of commands when given a visual or tactile cue. Attention is focused to speaker or tasked intermittently, again tactile and visual cues  needed. Dysarthria also probable but not assessed due to lack of speech. SLP will follow acutely to maximize functional communication. CIR consult recommended.     SLP Assessment  Patient needs continued Speech Lanaguage Pathology Services    Follow Up Recommendations  Inpatient Rehab    Frequency and Duration        Pertinent Vitals/Pain NA   SLP Goals     SLP Evaluation Prior Functioning  Cognitive/Linguistic Baseline: Information not available   Cognition  Overall Cognitive Status: Impaired/Different from baseline Arousal/Alertness: Awake/alert Orientation Level:  (UTA, pt does not respond to orientation ) Attention: Focused Focused Attention: Impaired Focused Attention Impairment: Verbal basic;Functional basic (needs mod visual tactile cues to attend)    Comprehension  Auditory Comprehension Overall Auditory Comprehension: Impaired Yes/No Questions: Impaired Basic Biographical Questions: 0-25% accurate Commands: Impaired One Step Basic Commands: 25-49% accurate Visual Recognition/Discrimination Discrimination: Exceptions to [facility] Common Objects: Unable to indentify    Expression Verbal Expression Overall Verbal Expression: Impaired Initiation: Impaired (does not initiate speech ) Common Objects: Unable to indentify Non-Verbal Means of Communication:  (none)   Oral / Motor Oral Motor/Sensory Function Overall Oral Motor/Sensory Function: Impaired Labial ROM: Reduced right Labial Symmetry: Abnormal symmetry right Labial Strength: Reduced Lingual ROM: Reduced right Lingual Symmetry: Abnormal symmetry right Lingual Strength: Reduced Facial ROM: Reduced right Facial Symmetry: Right droop Motor Speech Overall Motor Speech: Impaired (does not initiate speech, phonation head with min throat cle)   GO    Harlon Ditty, MA CCC-SLP [phone]  Laurie Ramirez 07/13/2013, 11:36 AM

## 2013-07-13 NOTE — Progress Notes (Signed)
INITIAL NUTRITION ASSESSMENT  DOCUMENTATION CODES Per approved criteria  -Obesity Unspecified   INTERVENTION:  1. Offer pt Magic cup TID between meals, each supplement provides 290 kcal and 9 grams of protein.  NUTRITION DIAGNOSIS: Inadequate oral intake related to altered mental status as evidenced by Meal Completion: 50%.   Goal: Pt to meet >/= 90% of their estimated nutrition needs   Monitor:  Diet advancement, PO intake, weight trend, labs   Reason for Assessment: Low Braden  79 y.o. female  Admitting Dx: Acute renal failure  ASSESSMENT: Pt admitted to [facility] 11/14 for acute renal failure and pancreatitis. On 11/17 pt found unable to respond to questions. Pt received t-PA and transferred to [facility]. Pt seen by SLP today and will be started on Dysphagia 1 with Pudding thick liquids. Pt will have a follow up swallow evaluation tomorrow and hopeful for diet advancement.  Pt was previously consuming approximately 50% of her meals.   Height: Ht Readings from Last 1 Encounters:  07/12/13 5\' 4"  (1.626 m)    Weight: Wt Readings from Last 1 Encounters:  07/12/13 215 lb 2.7 oz (97.6 kg)    Ideal Body Weight: 54.5 kg   % Ideal Body Weight: 179%  Wt Readings from Last 10 Encounters:  07/12/13 215 lb 2.7 oz (97.6 kg)  07/08/13 206 lb 1.3 oz (93.477 kg)  06/27/13 204 lb (92.534 kg)  06/21/13 218 lb 11.1 oz (99.2 kg)  05/17/13 190 lb (86.183 kg)    Usual Body Weight: 190-218 lb   % Usual Body Weight: 100%  BMI:  Body mass index is 36.92 kg/(m^2).  Estimated Nutritional Needs: Kcal: 1500-1700 Protein: 70-80 grams Fluid: > 1.5 L/day  Skin: abrasions right knee  Diet Order: NPO  EDUCATION NEEDS: -No education needs identified at this time   Intake/Output Summary (Last 24 hours) at 07/13/13 1121 Last data filed at 07/13/13 1100  Gross per 24 hour  Intake   1200 ml  Output      0 ml  Net   1200 ml    Last BM: 11/16   Labs:   Recent Labs Lab 07/09/13 0644  07/10/13 0600 07/11/13 0638  NA 137 139 140  K 4.4 3.6 4.1  CL 96 97 99  CO2 31 34* 35*  BUN 60* 48* 38*  CREATININE 2.83* 2.25* 1.75*  CALCIUM 9.9 9.3 9.4  GLUCOSE 136* 127* 133*    CBG (last 3)   Recent Labs  07/12/13 0009  GLUCAP 164*    Scheduled Meds: . sodium chloride  250 mL Intravenous Once  . aspirin  300 mg Rectal Daily  . metoprolol  5 mg Intravenous Q6H  . pantoprazole (PROTONIX) IV  40 mg Intravenous QHS    Continuous Infusions: . sodium chloride 50 mL/hr at 07/12/13 2000    Past Medical History  Diagnosis Date  . Essential hypertension, benign   . Atrial fibrillation   . Bronchitis   . Depression   . Chronic combined systolic and diastolic heart failure      LVEF 45-50%  . 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 Chronic systolic heart failure 06/18/2013    Past Surgical History  Procedure Laterality Date  . Shoulder surgery      right    Kendell Bane RD, LDN, CNSC [phone] Pager [phone] After Hours Pager

## 2013-07-14 ENCOUNTER — Inpatient Hospital Stay (HOSPITAL_COMMUNITY): Payer: Medicare Other

## 2013-07-14 DIAGNOSIS — I6340 Cerebral infarction due to embolism of unspecified cerebral artery: Secondary | ICD-10-CM

## 2013-07-14 LAB — BASIC METABOLIC PANEL
CO2: 26 mEq/L (ref 19–32)
Calcium: 9.1 mg/dL (ref 8.4–10.5)
Chloride: 105 mEq/L (ref 96–112)
Glucose, Bld: 174 mg/dL — ABNORMAL HIGH (ref 70–99)
Potassium: 2.9 mEq/L — ABNORMAL LOW (ref 3.5–5.1)
Sodium: 145 mEq/L (ref 135–145)

## 2013-07-14 LAB — MAGNESIUM: Magnesium: 1.2 mg/dL — ABNORMAL LOW (ref 1.5–2.5)

## 2013-07-14 MED ORDER — ATORVASTATIN CALCIUM 10 MG PO TABS
10.0000 mg | ORAL_TABLET | Freq: Every day | ORAL | Status: DC
  Administered 2013-07-14 – 2013-07-15 (×2): 10 mg via ORAL
  Filled 2013-07-14 (×3): qty 1

## 2013-07-14 MED ORDER — METOPROLOL TARTRATE 1 MG/ML IV SOLN
5.0000 mg | Freq: Four times a day (QID) | INTRAVENOUS | Status: DC
  Administered 2013-07-14 – 2013-07-15 (×4): 5 mg via INTRAVENOUS
  Filled 2013-07-14 (×10): qty 5

## 2013-07-14 MED ORDER — POTASSIUM CHLORIDE CRYS ER 20 MEQ PO TBCR
20.0000 meq | EXTENDED_RELEASE_TABLET | Freq: Three times a day (TID) | ORAL | Status: AC
  Administered 2013-07-14 (×3): 20 meq via ORAL
  Filled 2013-07-14 (×3): qty 1

## 2013-07-14 MED ORDER — MAGNESIUM SULFATE 50 % IJ SOLN
3.0000 g | Freq: Once | INTRAVENOUS | Status: AC
  Administered 2013-07-14: 3 g via INTRAVENOUS
  Filled 2013-07-14: qty 6

## 2013-07-14 MED ORDER — METOPROLOL TARTRATE 25 MG PO TABS
25.0000 mg | ORAL_TABLET | Freq: Two times a day (BID) | ORAL | Status: DC
  Filled 2013-07-14 (×2): qty 1

## 2013-07-14 NOTE — Progress Notes (Signed)
Stroke Team Progress Note  HISTORY Laurie Ramirez is a 79 y.o. female with a history of Afib who was not on anticoagulation due to bruising who presented to [facility] on 11/14 with nausea and vomiting. She was being treated for presumed pancreatitis and renal failure felt to be due to nausea and vomiting. Her creatinine at baseline is around 1.15 and was 3.16 on admission. Currently trending down to 1.75 this morning.   She was seen by nursing at 9:30pm and at that time appeared to be in her normal state talking and moving her right side.  Of note, there is discussion of starting apixaban, but there is no documentation of a dose given and therefore t-PA was given. TPA was started at 1:49 AM. Patient transferred from [facility].  Echo had shown possible apical thrombus. Patient in afib (not on anticoagulation due to bruising). Eliquis contemplated; however, had not started.  As per Dr. Amada Jupiter on admission: The patient received IV tpa and was transferred from [facility]. Phone numbers in chart were wrong, and subsequently once the correct number for the daughter was found, went right to voice mail. We had the sheriff try to locate the daughter for consent, but by the time I was able to get a hold of her there was not enough time to get team and then have a reasonable expectation of being able to perform a thrombectomy, so was not done.  SUBJECTIVE Patient opens eyes. Appears to sleepy to eat this am.   OBJECTIVE Most recent Vital Signs: Filed Vitals:   07/14/13 0500 07/14/13 0600 07/14/13 0700 07/14/13 0720  BP: 157/97   154/82  Pulse: 115 92 110 107  Temp:      TempSrc:      Resp: 21 24 21 21   Height:      Weight:      SpO2: 99% 94% 97% 98%   CBG (last 3)   Recent Labs  07/12/13 0009  GLUCAP 164*    IV Fluid Intake:   . sodium chloride 50 mL/hr at 07/13/13 2000  . diltiazem (CARDIZEM) infusion 15 mg/hr (07/14/13 0409)    MEDICATIONS  . sodium chloride  250 mL  Intravenous Once  . aspirin  300 mg Rectal Daily  . diltiazem  5 mg Intravenous Once  . metoprolol  5 mg Intravenous Q6H  . pantoprazole (PROTONIX) IV  40 mg Intravenous QHS   PRN:  acetaminophen, acetaminophen, labetalol  Diet:  Dysphagia 1 Activity:  Bedrest DVT Prophylaxis:  SCD  CLINICALLY SIGNIFICANT STUDIES Basic Metabolic Panel:   Recent Labs Lab 07/11/13 0638 07/14/13 0330  NA 140 145  K 4.1 2.9*  CL 99 105  CO2 35* 26  GLUCOSE 133* 174*  BUN 38* 20  CREATININE 1.75* 0.97  CALCIUM 9.4 9.1   Liver Function Tests:   Recent Labs Lab 07/08/13 1616  AST 26  ALT 11  ALKPHOS 113  BILITOT 1.2  PROT 7.6  ALBUMIN 3.2*   CBC:   Recent Labs Lab 07/08/13 1616 07/09/13 0644  WBC 9.8 9.1  HGB 18.5* 17.1*  HCT 55.7* 53.5*  MCV 95.4 95.7  PLT 182 196   Coagulation: No results found for this basename: LABPROT, INR,  in the last 168 hours Cardiac Enzymes: No results found for this basename: CKTOTAL, CKMB, CKMBINDEX, TROPONINI,  in the last 168 hours Urinalysis:   Recent Labs Lab 07/09/13 0242  COLORURINE YELLOW  LABSPEC 1.020  PHURINE 5.5  GLUCOSEU NEGATIVE  HGBUR MODERATE*  BILIRUBINUR NEGATIVE  KETONESUR NEGATIVE  PROTEINUR NEGATIVE  UROBILINOGEN 0.2  NITRITE NEGATIVE  LEUKOCYTESUR TRACE*   Lipid Panel    Component Value Date/Time   CHOL 206* 07/12/2013 0440   TRIG 201* 07/12/2013 0440   HDL 35* 07/12/2013 0440   CHOLHDL 5.9 07/12/2013 0440   VLDL 40 07/12/2013 0440   LDLCALC 131* 07/12/2013 0440   HgbA1C  Lab Results  Component Value Date   HGBA1C 6.7* 07/12/2013    Urine Drug Screen:   No results found for this basename: labopia,  cocainscrnur,  labbenz,  amphetmu,  thcu,  labbarb    Alcohol Level: No results found for this basename: ETH,  in the last 168 hours  Ct Head Wo Contrast 07/12/2013  : 1. Periventricular white matter and corona radiata hypodensities favor chronic ischemic microvascular white matter disease. No definite  acute intracranial findings.   11/18/20141. Large region of hypodensity in the left MCA distribution  compatible with MCA infarct. No hemorrhagic transformation observed.   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  Carotid Doppler   Carotid Duplex (Doppler) has been completed. Preliminary findings: Technically difficult due to patient movement.  Right = 1-39% ICA stenosis.  Left = Apparent occlusion of Common carotid artery from thrombus. Limited flow noted in external carotid, no flow noted in internal carotid  CXR    EKG  atrial fibrillation, rate 70s.   Therapy Recommendations   GENERAL EXAM: Patient is in no distress  CARDIOVASCULAR: IRREG RATE AND RHYTHM. No murmurs, no carotid bruits. NO SUPRACLAVICULAR BRUITS. DECR RADIAL PULSES BILATERALLY.  NEUROLOGIC: MENTAL STATUS: awake; LEFT GAZE PREFERENCE. RIGHT NEGLECT. MUTE. FOLLOWS SIMPLE COMMANDS (STICK TONGUE, HOLD UP ARM).  CRANIAL NERVE: pupils equal and reactive to light, BLINKS TO THREAT ON LEFT, NO BLINK TO THREAT ON RIGHT. LEFT GAZE DEVIATION, WITH DOLL'S EYES REFLEX PRESENT TO MIDLINE. No nystagmus, DECR RIGHT FACIAL STRENGTH.  MOTOR: RUE AND RLE 0/5. MINIMAL WITHDRAWAL IN RLE TO STIM. LUE 3-4/5, LLE 3/5.  SENSORY: WITHDRAWS ON LUE, LLE, RLE.  GAIT/STATION: IN BED.    ASSESSMENT Ms. Laurie Ramirez is a 79 y.o. female presenting with right hemiparesis. Status post IV t-PA 07/12/2013 at 0149. CT imaging shows chronic ischemic microvascular white matter disease, Large region of hypodensity in the left MCA distribution compatible with MCA infarct . 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  On aspirin 81 mg  orally every day prior to admission. Now on rectal aspirin 300mg  daily for secondary stroke prevention. Patient with resultant right hemiparesis. 07/14/2013 patient had increasing   Hyperlipidemia, LDL 131, goal <100  Abnormal urine, u/a requested with culture if positive  Apical thrombus, eliquis recommended per cardiology  Atrial fibrillation, now with RVR, ask cardiology to assist with control as she has been on amiodarone recently.  Chronic systolic heart failure  Hypertension  HBG A1C  6.7  Acute renal insufficiency improving. Chronic renal insufficiency cannot clinically determine stateg  Carotid stenosis, left carotid occlusion  Hypokalemic, replace  Hypomagnesemia, replace  Hospital day # 6  TREATMENT/PLAN  Continue rectal aspirin for secondary stroke prevention. Will start eliquis once PEG placed if decided upon.  Start statin when can take po.  Risk factor modification  Keep NPO until speech passes  Replete potassium  Bmet, Mg+ in am.  CIR consulted.   Gwendolyn Lima. Manson Passey, [facility], MBA, MHA [facility] Pager: [phone] 07/14/2013 7:48 AM  I have personally obtained a history, examined the patient, evaluated imaging results, and formulated the assessment and plan of care. I agree with the above.  Delia Heady, MD

## 2013-07-14 NOTE — Evaluation (Signed)
Physical Therapy Evaluation Patient Details Name: Laurie Ramirez MRN: [id number] DOB: 1932/03/15 Today's Date: 07/14/2013 Time: 4098-1191 PT Time Calculation (min): 26 min  PT Assessment / Plan / Recommendation History of Present Illness  Laurie Ramirez is a 79 y.o. female with a history of Afib who was not on anticoagulation due to bruising who presented to [facility] on 11/14 with nausea and vomiting. She was being treated for presumed pancreatitis and renal failure felt to be due to nausea and vomiting.She was seen by nursing at 9:30pm and at that time appeared to be in her normal state talking and moving her right side.  Pt was given tPA. L MCA CVA  Clinical Impression  Pt aphasic with right hemiplegia and decreased ability to follow commands as well as right neglect. Pt with these as well as below deficits and will benefit from acute therapy to maximize mobility and decrease burden of care. Recommend CIR for D/C. Pt with surprisingly good sitting balance on evaluation. Pt with hypokalemia awaiting repletion after MBS this afternoon. Ok with Dr.Sethi for mobility given current status. Pt with HR 110-128 with mobility, Afib. sats 985 RA. Pt without demonstration of pain. Will follow.     PT Assessment  Patient needs continued PT services    Follow Up Recommendations  CIR;Supervision/Assistance - 24 hour    Does the patient have the potential to tolerate intense rehabilitation      Barriers to Discharge        Equipment Recommendations  Other (comment) (TBD)    Recommendations for Other Services     Frequency Min 4X/week    Precautions / Restrictions Precautions Precautions: Fall Precaution Comments: right hemiplegia   Pertinent Vitals/Pain See above      Mobility  Bed Mobility Bed Mobility: Rolling Right;Rolling Left;Scooting to [facility];Sitting - Scoot to [place] of Bed;Supine to Sit;Sit to Supine Rolling Right: 1: +1 Total assist Rolling Left: 1: +1 Total assist Supine to  Sit: 2: Max assist Sitting - Scoot to Edge of Bed: 1: +1 Total assist Sit to Supine: 1: +2 Total assist Sit to Supine: Patient Percentage: 20% Scooting to HOB: 1: +2 Total assist Scooting to [facility]: Patient Percentage: 0% Details for Bed Mobility Assistance: Pt given cues to roll bil and not active assist noted but pt not resisting and able to move with one person assist. With [facility] elevated pivoted to EOB with pt able to maintain trunk control once assisted with elevation from surface at trunk. Pt assisting with propping on LUE to return to supine and total assist for legs onto surface Transfers Transfers: Sit to Stand Sit to Stand: 1: +2 Total assist;From bed Sit to Stand: Patient Percentage: 10% Details for Transfer Assistance: pt did assist with anterior trunk lean but unable to activate legs and elevate sacrum from surface with use of belt, pad and multimodal cueing Ambulation/Gait Ambulation/Gait Assistance: Not tested (comment) Modified Rankin (Stroke Patients Only) Pre-Morbid Rankin Score: No symptoms (presumed unable to contact family for accurate score) Modified Rankin: Severe disability    Exercises General Exercises - Lower Extremity Heel Slides: AAROM;Left;5 reps;Supine   PT Diagnosis: Altered mental status;Hemiplegia dominant side  PT Problem List: Decreased strength;Decreased cognition;Decreased knowledge of use of DME;Decreased activity tolerance;Decreased balance;Decreased mobility;Decreased coordination;Impaired sensation;Obesity PT Treatment Interventions: Functional mobility training;Therapeutic activities;Therapeutic exercise;Patient/family education;Cognitive remediation;Neuromuscular re-education;Balance training     PT Goals(Current goals can be found in the care plan section) Acute Rehab PT Goals PT Goal Formulation: Patient unable to participate in goal setting  Time For Goal Achievement: 07/28/13 Potential to Achieve Goals: Fair  Visit Information  Last PT  Received On: 07/14/13 Assistance Needed: +2 History of Present Illness: Laurie Ramirez is a 79 y.o. female with a history of Afib who was not on anticoagulation due to bruising who presented to [facility] on 11/14 with nausea and vomiting. She was being treated for presumed pancreatitis and renal failure felt to be due to nausea and vomiting.She was seen by nursing at 9:30pm and at that time appeared to be in her normal state talking and moving her right side.  Pt was given tPA. L MCA CVA       Prior Functioning  Home Living Family/patient expects to be discharged to:: Private residence Living Arrangements: Children Additional Comments: per chart. Pt unable to provide PLOF and called both contact numbers with no answer    Cognition  Cognition Arousal/Alertness: Lethargic Behavior During Therapy: Flat affect Overall Cognitive Status: Impaired/Different from baseline Area of Impairment: Attention;Following commands Current Attention Level: Focused Following Commands: Follows one step commands inconsistently General Comments: Pt with right neglect, right gaze preference and unable to shift past midline, pt nonverbal and only followed grossly 5% of commands given with multimodal commands    Extremity/Trunk Assessment Upper Extremity Assessment Upper Extremity Assessment: Defer to OT evaluation Lower Extremity Assessment Lower Extremity Assessment: RLE deficits/detail;LLE deficits/detail RLE Deficits / Details: unable to illicit response to pain or movement on command. Strength 0/5, ROM WFL RLE Sensation: decreased light touch RLE Coordination: decreased gross motor LLE Deficits / Details: ROM WFL, grossly 2/5 but difficult to fully assess due to lack of ability to follow commands but moving on her own in bed Cervical / Trunk Assessment Cervical / Trunk Assessment: Kyphotic   Balance Balance Balance Assessed: Yes Static Sitting Balance Static Sitting - Balance Support: Left upper  extremity supported;No upper extremity supported;Feet supported Static Sitting - Level of Assistance: 5: Stand by assistance Static Sitting - Comment/# of Minutes: 3, pt able to vary from LUE assist to no UE assist and maintain balance throughout  End of Session PT - End of Session Equipment Utilized During Treatment: Gait belt Activity Tolerance: Patient tolerated treatment well Patient left: in bed;with call bell/phone within reach Nurse Communication: Mobility status;Need for lift equipment  GP     Delorse Lek 07/14/2013, 10:33 AM Delaney Meigs, PT [phone]

## 2013-07-14 NOTE — Procedures (Signed)
Objective Swallowing Evaluation: Modified Barium Swallowing Study  Patient Details  Name: Laurie Ramirez MRN: [id number] Date of Birth: 1931/09/23  Today's Date: 07/14/2013 Time: 1330-1400 SLP Time Calculation (min): 30 min  Past Medical History:  Past Medical History  Diagnosis Date  . Essential hypertension, benign   . Atrial fibrillation   . Bronchitis   . Depression   . Chronic combined systolic and diastolic heart failure      LVEF 45-50%  . 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 Chronic systolic heart failure 06/18/2013   Past Surgical History:  Past Surgical History  Procedure Laterality Date  . Shoulder surgery      right   HPI:  79 year old female with a history of CHF, A. fib, presented to [facility] on 11/14 with nausea and vomiting. She was being treated for presumed pancreatitis and renal failure felt to be due to nausea and vomiting.  She was seen by nursing at 9:30pm and at that time appeared to be in her normal state talking and moving her right side. Possible apical thrombus on echo with signs and symptoms of a large left MCA infarct. She has received IV TPA.     Assessment / Plan / Recommendation Clinical Impression  Dysphagia Diagnosis: Moderate oral phase dysphagia;Mild pharyngeal phase dysphagia Clinical impression: Pt presents with moderate deficits, primarily due to slow initiation of oral movement for bolus transit. Pt has reduced opening of mouth for boluses, anterior spillage. Pt requires spoon feeding. Despite weakness, oral residuals are mild. There is a delayed intiation of the swallow response; puree and honey thick liquids are tolerated well but nectar thick liquids result in sensed aspiration before the swallow. Strength of pharyngeal funciton WNL, no pharyngeal residuals observed. Recommend pt proceed with Dys 1/pudding thick liquids, SLP will likely upgrade pt to honey thick liqiuds tomorrow at bedside.     Treatment Recommendation  Therapy as outlined in  treatment plan below    Diet Recommendation Dysphagia 1 (Puree);Pudding-thick liquid   Liquid Administration via: Spoon Medication Administration: Crushed with puree Supervision: Staff to assist with self feeding Compensations: Slow rate;Small sips/bites;Check for anterior loss Postural Changes and/or Swallow Maneuvers: Seated upright 90 degrees    Other  Recommendations Oral Care Recommendations: Oral care Q4 per protocol;Oral care BID Other Recommendations: Have oral suction available   Follow Up Recommendations  Inpatient Rehab    Frequency and Duration min 2x/week  2 weeks   Pertinent Vitals/Pain NA    SLP Swallow Goals     General HPI: 78 year old female with a history of CHF, A. fib, presented to [facility] on 11/14 with nausea and vomiting. She was being treated for presumed pancreatitis and renal failure felt to be due to nausea and vomiting.  She was seen by nursing at 9:30pm and at that time appeared to be in her normal state talking and moving her right side. Possible apical thrombus on echo with signs and symptoms of a large left MCA infarct. She has received IV TPA. Type of Study: Modified Barium Swallowing Study Reason for Referral: Objectively evaluate swallowing function Diet Prior to this Study: Dysphagia 1 (puree);Pudding-thick liquids Temperature Spikes Noted: No Respiratory Status: Nasal cannula History of Recent Intubation: No Behavior/Cognition: Alert Oral Cavity - Dentition: Dentures, top;Dentures, bottom Oral Motor / Sensory Function: Impaired - see Bedside swallow eval Self-Feeding Abilities: Total assist Patient Positioning: Upright in chair Baseline Vocal Quality:  (does not initiate speech) Volitional Cough: Cognitively unable to elicit Volitional Swallow: Unable to elicit  Reason for Referral Objectively evaluate swallowing function   Oral Phase Oral Preparation/Oral Phase Oral Phase: Impaired Oral - Honey Oral - Honey Teaspoon: Left  anterior bolus loss;Right anterior bolus loss;Holding of bolus;Delayed oral transit Oral - Nectar Oral - Nectar Teaspoon: Left anterior bolus loss;Right anterior bolus loss;Holding of bolus;Delayed oral transit Oral - Solids Oral - Puree: Left anterior bolus loss;Right anterior bolus loss;Holding of bolus;Delayed oral transit Oral - Pill: Holding of bolus   Pharyngeal Phase Pharyngeal Phase Pharyngeal Phase: Impaired Pharyngeal - Honey Pharyngeal - Honey Teaspoon: Delayed swallow initiation Pharyngeal - Nectar Pharyngeal - Nectar Teaspoon: Delayed swallow initiation;Premature spillage to pyriform sinuses;Penetration/Aspiration before swallow;Moderate aspiration Penetration/Aspiration details (nectar teaspoon): Material enters airway, passes BELOW cords and not ejected out despite cough attempt by patient Pharyngeal - Solids Pharyngeal - Puree: Delayed swallow initiation  Cervical Esophageal Phase    GO   Harlon Ditty, MA CCC-SLP [phone]            Claudine Mouton 07/14/2013, 3:03 PM

## 2013-07-14 NOTE — Progress Notes (Signed)
RN reports pt with significant difficulty even taking powdered meds in puree. SLP will plan for MBS at 1330 today to objectively evaluate swallow function. Harlon Ditty, MA CCC-SLP [phone]

## 2013-07-14 NOTE — Progress Notes (Signed)
I spoke with pt's daughter, Bobbye Morton, by phone. Pt lives with her , a sister, and brother in [place]. I am meeting with them at 4 pm today to assess caregiver support once discharged to assist with determining rehab venue options. [phone]

## 2013-07-14 NOTE — Progress Notes (Addendum)
I met with pt's, daughter, Bobbye Morton, at bedside. Patient will need prolonged rehab before returning home with family able to provide needed physical assistance. They would like [facility] [place] for her rehab. We will sign off. I have made SW aware. [phone]

## 2013-07-14 NOTE — Evaluation (Signed)
Occupational Therapy Evaluation Patient Details Name: Laurie Ramirez MRN: [id number] DOB: September 01, 1931 Today's Date: 07/14/2013 Time: 6440-3474 OT Time Calculation (min): 21 min  OT Assessment / Plan / Recommendation History of present illness Laurie Ramirez is a 79 y.o. female with a history of Afib who was not on anticoagulation due to bruising who presented to [facility] on 11/14 with nausea and vomiting. She was being treated for presumed pancreatitis and renal failure felt to be due to nausea and vomiting.She was seen by nursing at 9:30pm and at that time appeared to be in her normal state talking and moving her right side.  Pt was given tPA. L MCA CVA   Clinical Impression   Pt presents with dense R hemiplegia and neglect, L gaze preference, dependence in all ADL and requires +2 assist for mobility.  She did not follow one step commands.  Pt with tachycardia at rest which remained with activity in the same range.  Pt will need intense rehab.  Will follow acutely.    OT Assessment  Patient needs continued OT Services    Follow Up Recommendations  CIR    Barriers to Discharge      Equipment Recommendations   (TBD)    Recommendations for Other Services Rehab consult  Frequency  Min 3X/week    Precautions / Restrictions Precautions Precautions: Fall Precaution Comments: right hemiplegia Restrictions Weight Bearing Restrictions: No   Pertinent Vitals/Pain HR 110-125, afib, sats in mid 90s on RA, did not indicate pain    ADL  Eating/Feeding: NPO Where Assessed - Eating/Feeding: Bed level ADL Comments: Total assist for all ADL, NPO.    OT Diagnosis: Generalized weakness;Cognitive deficits;Disturbance of vision;Hemiplegia dominant side  OT Problem List: Decreased strength;Decreased activity tolerance;Impaired balance (sitting and/or standing);Impaired vision/perception;Decreased coordination;Decreased cognition;Decreased knowledge of use of DME or AE;Decreased knowledge of  precautions;Cardiopulmonary status limiting activity;Obesity;Impaired sensation;Impaired UE functional use OT Treatment Interventions: Self-care/ADL training;Neuromuscular education;Therapeutic activities;Cognitive remediation/compensation;Visual/perceptual remediation/compensation;Patient/family education;Balance training   OT Goals(Current goals can be found in the care plan section) Acute Rehab OT Goals Time For Goal Achievement: 07/28/13 Potential to Achieve Goals: Fair ADL Goals Pt Will Perform Grooming: with mod assist;bed level;sitting (with hand over hand assist to initiate) Additional ADL Goal #1: Pt will turn head to locate objects and people on her R with min auditory cues. Additional ADL Goal #2: Pt will maintain attention to task for 30 seconds while involved in therapeutic activity. Additional ADL Goal #3: Pt will sit EOB with min guard assist x 10 minutes as a precursor to ADL. Additional ADL Goal #4: Pt will follow simple one step commands 50% of time.  Visit Information  Last OT Received On: 07/14/13 Assistance Needed: +2 History of Present Illness: Laurie Ramirez is a 79 y.o. female with a history of Afib who was not on anticoagulation due to bruising who presented to [facility] on 11/14 with nausea and vomiting. She was being treated for presumed pancreatitis and renal failure felt to be due to nausea and vomiting.She was seen by nursing at 9:30pm and at that time appeared to be in her normal state talking and moving her right side.  Pt was given tPA. L MCA CVA       Prior Functioning     Home Living Family/patient expects to be discharged to:: Private residence Living Arrangements: Children (2 daughters and a son in [place]) Available Help at Discharge:  (one daughter works, the other is in school) Additional Comments: pt unable to communicate  PLOF or living situation, no family available Prior Function Level of Independence:  (used RW to  ambulate) Communication Communication:  (non verbal)         Vision/Perception Vision - History Patient Visual Report:  (keeps R eye closed, did not track, responded to threat on L)   Cognition  Cognition Arousal/Alertness: Lethargic Behavior During Therapy: Flat affect Overall Cognitive Status: Impaired/Different from baseline Area of Impairment: Attention;Following commands Current Attention Level: Focused Following Commands: Follows one step commands inconsistently General Comments: Pt with right neglect, right gaze preference and unable to shift past midline, pt nonverbal and only followed grossly 5% of commands given with multimodal commands    Extremity/Trunk Assessment Upper Extremity Assessment Upper Extremity Assessment: RUE deficits/detail RUE Deficits / Details: No movement observed, PROM full and appeared pain free, crepitus in wrist, OA changes in fingers RUE Sensation:  (unable to elicit response to movement or pain) RUE Coordination: decreased fine motor;decreased gross motor Lower Extremity Assessment Lower Extremity Assessment: Defer to PT evaluation RLE Deficits / Details: unable to illicit response to pain or movement on command. Strength 0/5, ROM WFL RLE Sensation: decreased light touch RLE Coordination: decreased gross motor LLE Deficits / Details: ROM WFL, grossly 2/5 but difficult to fully assess due to lack of ability to follow commands but moving on her own in bed Cervical / Trunk Assessment Cervical / Trunk Assessment: Kyphotic     Mobility Bed Mobility Bed Mobility: Rolling Right;Rolling Left;Scooting to [facility];Sitting - Scoot to [place] of Bed;Supine to Sit;Sit to Supine Rolling Right: 1: +1 Total assist Rolling Left: 1: +1 Total assist Supine to Sit: 2: Max assist Sitting - Scoot to Edge of Bed: 1: +1 Total assist Sit to Supine: 1: +2 Total assist Sit to Supine: Patient Percentage: 20% Scooting to HOB: 1: +2 Total assist Scooting to [facility]: Patient  Percentage: 0%    Exercise PROM R UE all areas and positioned on pillow  Balance   End of Session OT - End of Session Activity Tolerance: Patient limited by fatigue (pt with tachycardia, afib) Patient left: in bed;with call bell/phone within reach  GO     Evern Bio 07/14/2013, 11:28 AM [phone]

## 2013-07-14 NOTE — Consult Note (Signed)
Physical Medicine and Rehabilitation Consult Reason for Consult: CVA Referring Physician: Dr. Pearlean Brownie   HPI: Laurie Ramirez is a 79 y.o. right-handed female with history hypertension, atrial fibrillation/apical thrombus on no anticoagulation secondary to bruising and chronic systolic heart failure  Admitted 07/08/2013 with hypotension 90/50, poor po intake and vomiting. Findings of elevated creatinine 2.4 3.1 question acute on chronic. Patient's lisinopril was held. Noted on 07/12/2013 with findings of right-sided weakness and aphasia. Cranial CT scan showed large region of hypodensity left MCA distribution compatible with MCA infarct. Echocardiogram with ejection fraction 50% grade 2 diastolic dysfunction possible apical thrombus. Carotid Dopplers with apparent occlusion common carotid artery from thrombus. Patient did receive TPA. Currently on aspirin therapy for stroke prevention question plan Eliquis once patient able to take improved po insistence he. Patient currently on dysphagia 1 pudding thick liquids. Cardiology services followup for history of atrial fibrillation currently on intravenous Cardizem. Physical occupational therapy ongoing limited evaluation secondary to medical complications. M.D. is requested physical medicine rehabilitation consult   Review of Systems  Unable to perform ROS: mental acuity   Past Medical History  Diagnosis Date  . Essential hypertension, benign   . Atrial fibrillation   . Bronchitis   . Depression   . Chronic combined systolic and diastolic heart failure      LVEF 45-50%  . 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 Chronic systolic heart failure 06/18/2013   Past Surgical History  Procedure Laterality Date  . Shoulder surgery      right   Family History  Problem Relation Age of Onset  . Heart failure Mother    Social History:  reports that she has never smoked. She does not have any smokeless tobacco history on file. She reports that she does not drink  alcohol or use illicit drugs. Allergies: No Known Allergies Medications Prior to Admission  Medication Sig Dispense Refill  . acetaminophen (TYLENOL) 500 MG tablet Take 1,000 mg by mouth every 6 (six) hours as needed for pain.      Marland Kitchen amiodarone (PACERONE) 200 MG tablet Take 200 mg by mouth daily.      Marland Kitchen aspirin EC 81 MG tablet Take 81 mg by mouth daily.      . DULoxetine (CYMBALTA) 30 MG capsule Take 30 mg by mouth daily.      . furosemide (LASIX) 40 MG tablet Take 40 mg by mouth daily.       Marland Kitchen lisinopril-hydrochlorothiazide (PRINZIDE,ZESTORETIC) 20-12.5 MG per tablet Take 1 tablet by mouth daily.      . metoprolol (LOPRESSOR) 100 MG tablet Take 100 mg by mouth 2 (two) times daily.      . Olopatadine HCl (PATADAY) 0.2 % SOLN Apply 1 drop to eye daily as needed (allergies).      . ondansetron (ZOFRAN) 4 MG tablet Take 4 mg by mouth every 8 (eight) hours as needed for nausea or vomiting.      . potassium chloride SA (K-DUR,KLOR-CON) 20 MEQ tablet Take 2 tablets (40 mEq total) by mouth daily.  60 tablet  1    Home: Home Living Family/patient expects to be discharged to:: Private residence Living Arrangements: Children  Functional History:   Functional Status:  Mobility:          ADL:    Cognition: Cognition Overall Cognitive Status: Impaired/Different from baseline Arousal/Alertness: Awake/alert Orientation Level:  (global aphasia) Attention: Focused Focused Attention: Impaired Focused Attention Impairment: Verbal basic;Functional basic (needs mod visual tactile cues to attend) Cognition Overall Cognitive Status:  Impaired/Different from baseline  Blood pressure 154/82, pulse 107, temperature 99.1 F (37.3 C), temperature source Axillary, resp. rate 21, height 5\' 4"  (1.626 m), weight 97.6 kg (215 lb 2.7 oz), SpO2 98.00%. Physical Exam  Vitals reviewed. Eyes:  Pupils reactive to light  Neck: Neck supple. No thyromegaly present.  Cardiovascular:  Cardiac rate controlled   Respiratory: No respiratory distress.  GI: Soft. Bowel sounds are normal. She exhibits no distension.  Neurological:  Patient makes eye contact with examiner she is nonverbal with bilateral mittens in place easily distracted. She did not follow commands. Right inattention. Right facial and glosso-oral weakness. Global aphasia. Able to maintain sitting balance somewhat with support. 0/5 on MMT of RUE and RLE. Does withdraw to pain when Right thigh/calf pinch.   Skin: Skin is warm and dry.  Psychiatric:  Flat (neuro)    Results for orders placed during the hospital encounter of 07/08/13 (from the past 24 hour(s))  BASIC METABOLIC PANEL     Status: Abnormal   Collection Time    07/14/13  3:30 AM      Result Value Range   Sodium 145  135 - 145 mEq/L   Potassium 2.9 (*) 3.5 - 5.1 mEq/L   Chloride 105  96 - 112 mEq/L   CO2 26  19 - 32 mEq/L   Glucose, Bld 174 (*) 70 - 99 mg/dL   BUN 20  6 - 23 mg/dL   Creatinine, Ser 1.61  0.50 - 1.10 mg/dL   Calcium 9.1  8.4 - 09.6 mg/dL   GFR calc non Af Amer 54 (*) >90 mL/min   GFR calc Af Amer 63 (*) >90 mL/min   Ct Head Wo Contrast  07/12/2013   CLINICAL DATA:  TPA treatment, 24 hr followup.  EXAM: CT HEAD WITHOUT CONTRAST  TECHNIQUE: Contiguous axial images were obtained from the base of the skull through the vertex without intravenous contrast.  COMPARISON:  07/12/2013 at 12:28 a.m.  FINDINGS: Abnormal hypodensity and loss of gray-white differentiation in the left MCA distribution is compatible with a large left MCA distribution infarct. No hemorrhagic transformation observed.  Periventricular white matter and corona radiata hypodensities favor chronic ischemic microvascular white matter disease.  No midline shift. No ventricular effacement. No significant extra-axial fluid collection. No mass lesion noted.  IMPRESSION: 1. Large region of hypodensity in the left MCA distribution compatible with MCA infarct. No hemorrhagic transformation observed.    Electronically Signed   By: Herbie Baltimore M.D.   On: 07/12/2013 22:59   Dg Chest Port 1 View  07/12/2013   CLINICAL DATA:  Stroke.  EXAM: PORTABLE CHEST - 1 VIEW  COMPARISON:  June 27, 2013.  FINDINGS: Stable mild cardiomegaly. No acute pulmonary disease is noted. No pleural effusion or pneumothorax is noted. Bony thorax is intact.  IMPRESSION: No acute cardiopulmonary abnormality seen.   Electronically Signed   By: Roque Lias M.D.   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 Does the need for close, 24 hr/day medical supervision in concert with the patient's rehab needs make it unreasonable for this patient to be served in a less intensive setting? Yes 2. Co-Morbidities requiring supervision/potential complications: afib, htn, morbid obesity 3. Due to bladder management, bowel management, safety, skin/wound care, disease management, medication administration, pain management and patient education, does the patient require 24 hr/day rehab nursing? Yes 4. Does the patient require coordinated care of a physician, rehab nurse, PT (1-2 hrs/day,  5 days/week), OT (1-2 hrs/day, 5 days/week) and SLP (1-2 hrs/day, 5 days/week) to address physical and functional deficits in the context of the above medical diagnosis(es)? Potentially Addressing deficits in the following areas: balance, endurance, locomotion, strength, transferring, bowel/bladder control, bathing, dressing, feeding, grooming, toileting, cognition, speech, language, swallowing and psychosocial support 5. Can the patient actively participate in an intensive therapy program of at least 3 hrs of therapy per day at least 5 days per week? Yes and Potentially 6. The potential for patient to make measurable gains while on inpatient rehab is excellent and good 7. Anticipated functional outcomes upon discharge from inpatient rehab are min to mod assist with PT, min to mod assist with OT, min to  mod assist with SLP. 8. Estimated rehab length of stay to reach the above functional goals is: 25-30 days 9. Does the patient have adequate social supports to accommodate these discharge functional goals? Potentially 10. Anticipated D/C setting: Home 11. Anticipated post D/C treatments: HH therapy 12. Overall Rehab/Functional Prognosis: good  RECOMMENDATIONS: This patient's condition is appropriate for continued rehabilitative care in the following setting: CIR Patient has agreed to participate in recommended program. n/a Note that insurance prior authorization may be required for reimbursement for recommended care.  Comment: Pt will need min to mod assist at discharge. Need to follow up on social supports.   Ranelle Oyster, MD, [facility] Physical Medicine & Rehabilitation     07/14/2013

## 2013-07-14 NOTE — Progress Notes (Signed)
I await therapy evaluations to assist with determining rehab venue options. [phone]

## 2013-07-15 LAB — BASIC METABOLIC PANEL
CO2: 29 mEq/L (ref 19–32)
Calcium: 9.3 mg/dL (ref 8.4–10.5)
Chloride: 109 mEq/L (ref 96–112)
Creatinine, Ser: 0.82 mg/dL (ref 0.50–1.10)
Glucose, Bld: 138 mg/dL — ABNORMAL HIGH (ref 70–99)
Potassium: 3.1 mEq/L — ABNORMAL LOW (ref 3.5–5.1)
Sodium: 148 mEq/L — ABNORMAL HIGH (ref 135–145)

## 2013-07-15 LAB — URINALYSIS, ROUTINE W REFLEX MICROSCOPIC
Glucose, UA: 500 mg/dL — AB
Hgb urine dipstick: NEGATIVE
Leukocytes, UA: NEGATIVE
Protein, ur: 30 mg/dL — AB
Specific Gravity, Urine: 1.021 (ref 1.005–1.030)
pH: 5.5 (ref 5.0–8.0)

## 2013-07-15 LAB — MAGNESIUM: Magnesium: 2.1 mg/dL (ref 1.5–2.5)

## 2013-07-15 LAB — URINE MICROSCOPIC-ADD ON

## 2013-07-15 MED ORDER — METOPROLOL TARTRATE 50 MG PO TABS
50.0000 mg | ORAL_TABLET | Freq: Two times a day (BID) | ORAL | Status: DC
  Administered 2013-07-15 – 2013-07-16 (×3): 50 mg via ORAL
  Filled 2013-07-15 (×4): qty 1

## 2013-07-15 MED ORDER — METOPROLOL TARTRATE 25 MG PO TABS
25.0000 mg | ORAL_TABLET | Freq: Two times a day (BID) | ORAL | Status: DC
  Filled 2013-07-15 (×2): qty 1

## 2013-07-15 MED ORDER — ASPIRIN EC 325 MG PO TBEC: 325.0000 mg | DELAYED_RELEASE_TABLET | Freq: Every day | ORAL | Status: DC

## 2013-07-15 MED ORDER — PANTOPRAZOLE SODIUM 40 MG PO TBEC
40.0000 mg | DELAYED_RELEASE_TABLET | Freq: Every day | ORAL | Status: DC
  Administered 2013-07-16: 40 mg via ORAL
  Filled 2013-07-15: qty 1

## 2013-07-15 MED ORDER — POTASSIUM CHLORIDE CRYS ER 20 MEQ PO TBCR
40.0000 meq | EXTENDED_RELEASE_TABLET | Freq: Two times a day (BID) | ORAL | Status: DC
  Administered 2013-07-15 (×2): 40 meq via ORAL
  Filled 2013-07-15 (×4): qty 2

## 2013-07-15 MED ORDER — APIXABAN 5 MG PO TABS
5.0000 mg | ORAL_TABLET | Freq: Two times a day (BID) | ORAL | Status: DC
  Administered 2013-07-15 – 2013-07-16 (×3): 5 mg via ORAL
  Filled 2013-07-15 (×4): qty 1

## 2013-07-15 MED ORDER — AMIODARONE HCL 200 MG PO TABS
200.0000 mg | ORAL_TABLET | Freq: Every day | ORAL | Status: DC
  Administered 2013-07-15 – 2013-07-16 (×2): 200 mg via ORAL
  Filled 2013-07-15 (×3): qty 1

## 2013-07-15 NOTE — Progress Notes (Signed)
Pt had small void on diaper after 8hrs. Bladder looked distentend. Bladder scanned pt for 684 cc in bladder. Spoke with oncall MD(Dr Thad Ranger). Will insert foley at this time due to urine retention.

## 2013-07-15 NOTE — Progress Notes (Addendum)
Stroke Team Progress Note  HISTORY Laurie Ramirez is a 79 y.o. female with a history of Afib who was not on anticoagulation due to bruising who presented to [facility] on 11/14 with nausea and vomiting. She was being treated for presumed pancreatitis and renal failure felt to be due to nausea and vomiting. Her creatinine at baseline is around 1.15 and was 3.16 on admission. Currently trending down to 1.75 this morning.   She was seen by nursing at 9:30pm and at that time appeared to be in her normal state talking and moving her right side.  Of note, there is discussion of starting apixaban, but there is no documentation of a dose given and therefore t-PA was given. TPA was started at 1:49 AM. Patient transferred from [facility].  Echo had shown possible apical thrombus. Patient in afib (not on anticoagulation due to bruising). Eliquis contemplated; however, had not started.  As per Dr. Amada Jupiter on admission: The patient received IV tpa and was transferred from [facility]. Phone numbers in chart were wrong, and subsequently once the correct number for the daughter was found, went right to voice mail. We had the sheriff try to locate the daughter for consent, but by the time I was able to get a hold of her there was not enough time to get team and then have a reasonable expectation of being able to perform a thrombectomy, so was not done.  SUBJECTIVE More awake. Feels better.   OBJECTIVE Most recent Vital Signs: Filed Vitals:   07/15/13 0400 07/15/13 0500 07/15/13 0600 07/15/13 0700  BP: 149/67 139/60 158/55 123/58  Pulse: 92 94 88 90  Temp: 98.7 F (37.1 C)     TempSrc: Oral     Resp: 22 27 19    Height:      Weight:      SpO2: 99% 98% 100% 100%   CBG (last 3)  No results found for this basename: GLUCAP,  in the last 72 hours  IV Fluid Intake:   . sodium chloride 50 mL/hr at 07/13/13 2000  . diltiazem (CARDIZEM) infusion 15 mg/hr (07/15/13 0117)    MEDICATIONS  . sodium  chloride  250 mL Intravenous Once  . aspirin  300 mg Rectal Daily  . atorvastatin  10 mg Oral q1800  . diltiazem  5 mg Intravenous Once  . metoprolol  5 mg Intravenous Q6H  . pantoprazole (PROTONIX) IV  40 mg Intravenous QHS   PRN:  acetaminophen, acetaminophen, labetalol  Diet:  Dysphagia 1 Activity:  Bedrest DVT Prophylaxis:  SCD  CLINICALLY SIGNIFICANT STUDIES Basic Metabolic Panel:   Recent Labs Lab 07/14/13 0330 07/15/13 0329  NA 145 148*  K 2.9* 3.1*  CL 105 109  CO2 26 29  GLUCOSE 174* 138*  BUN 20 18  CREATININE 0.97 0.82  CALCIUM 9.1 9.3  MG 1.2* 2.1   Liver Function Tests:   Recent Labs Lab 07/08/13 1616  AST 26  ALT 11  ALKPHOS 113  BILITOT 1.2  PROT 7.6  ALBUMIN 3.2*   CBC:   Recent Labs Lab 07/08/13 1616 07/09/13 0644  WBC 9.8 9.1  HGB 18.5* 17.1*  HCT 55.7* 53.5*  MCV 95.4 95.7  PLT 182 196   Coagulation: No results found for this basename: LABPROT, INR,  in the last 168 hours Cardiac Enzymes: No results found for this basename: CKTOTAL, CKMB, CKMBINDEX, TROPONINI,  in the last 168 hours Urinalysis:   Recent Labs Lab 07/09/13 0242 07/15/13 0434  COLORURINE  YELLOW AMBER*  LABSPEC 1.020 1.021  PHURINE 5.5 5.5  GLUCOSEU NEGATIVE 500*  HGBUR MODERATE* NEGATIVE  BILIRUBINUR NEGATIVE SMALL*  KETONESUR NEGATIVE NEGATIVE  PROTEINUR NEGATIVE 30*  UROBILINOGEN 0.2 1.0  NITRITE NEGATIVE NEGATIVE  LEUKOCYTESUR TRACE* NEGATIVE   Lipid Panel    Component Value Date/Time   CHOL 206* 07/12/2013 0440   TRIG 201* 07/12/2013 0440   HDL 35* 07/12/2013 0440   CHOLHDL 5.9 07/12/2013 0440   VLDL 40 07/12/2013 0440   LDLCALC 131* 07/12/2013 0440   HgbA1C  Lab Results  Component Value Date   HGBA1C 6.7* 07/12/2013    Urine Drug Screen:   No results found for this basename: labopia,  cocainscrnur,  labbenz,  amphetmu,  thcu,  labbarb    Alcohol Level: No results found for this basename: ETH,  in the last 168 hours  Ct Head Wo  Contrast 07/12/2013  : 1. Periventricular white matter and corona radiata hypodensities favor chronic ischemic microvascular white matter disease. No definite acute intracranial findings.   11/18/20141. Large region of hypodensity in the left MCA distribution  compatible with MCA infarct. No hemorrhagic transformation observed.   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  Carotid Doppler   Carotid Duplex (Doppler) has been completed. Preliminary findings: Technically difficult due to patient movement.  Right = 1-39% ICA stenosis.  Left = Apparent occlusion of Common carotid artery from thrombus. Limited flow noted in external carotid, no flow noted in internal carotid  CXR    EKG  atrial fibrillation, rate 70s.   Therapy Recommendations   GENERAL EXAM: Patient is in no distress  CARDIOVASCULAR: IRREG RATE AND RHYTHM. No murmurs, no carotid bruits. NO SUPRACLAVICULAR BRUITS. DECR RADIAL PULSES BILATERALLY.  NEUROLOGIC: MENTAL STATUS: awake; severe expressive and receptive aphasia. FOLLOWS SIMPLE COMMANDS (STICK TONGUE, HOLD UP ARM).  CRANIAL NERVE: pupils equal and reactive to light, BLINKS TO THREAT ON LEFT, NO BLINK TO THREAT ON RIGHT. LEFT GAZE DEVIATION, WITH DOLL'S EYES REFLEX PRESENT TO MIDLINE. No nystagmus, DECR RIGHT FACIAL STRENGTH.  MOTOR: RUE AND RLE 0/5. MINIMAL WITHDRAWAL IN RLE TO STIM. LUE 3-4/5, LLE 3/5.  SENSORY: WITHDRAWS ON LUE, LLE, RLE.  GAIT/STATION: IN BED.    ASSESSMENT Ms. Laurie Ramirez is a 79 y.o. female presenting with right hemiparesis. Status post IV t-PA 07/12/2013 at 0149. CT imaging shows chronic ischemic microvascular white matter disease, Large region of hypodensity in the left MCA distribution compatible with MCA infarct .  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  On aspirin 81 mg orally every day prior to admission. Now on rectal aspirin 300mg  daily for secondary stroke prevention. Patient with resultant right hemiparesis. 07/14/2013 patient had increasing   Hyperlipidemia, LDL 131, goal <100  Abnormal urine, u/a requested with culture if positive  Apical thrombus, eliquis recommended per cardiology  Atrial fibrillation, now with RVR, ask cardiology to assist with control as she has been on amiodarone recently.  Chronic systolic heart failure  Hypertension  HBG A1C  6.7  Acute renal insufficiency improving. Chronic renal insufficiency cannot clinically determine stateg  Carotid stenosis, left carotid occlusion  Hypokalemic, replace  Hypomagnesemia, replaced  and resolved  Hospital day # 7  TREATMENT/PLAN  Continue rectal aspirin for secondary stroke prevention. Will start eliquis once PEG placed if decided upon.  Start statin when can take po.  Risk factor modification  Passed swallow  Replete potassium  CIR consulted. Patient wants to go to [facility] for rehab.  Patient must be weaned off of IV cardizem. I am restarting home medications. Once this is done, Patient may be discharged to rehab.  Gwendolyn Lima. Manson Passey, [facility], MBA, MHA [facility] Pager: [phone] 07/15/2013 7:41 AM  I have personally obtained a history, examined the patient, evaluated imaging results, and formulated the assessment and plan of care. I agree with the above.  Delia Heady, MD

## 2013-07-15 NOTE — Clinical Social Work Note (Signed)
Clinical Social Worker received referral from inpatient rehab admissions coordinator, stating that patient will need SNF placement at discharge.  Per inpatient rehab admissions coordinator, patient daughter requesting placement at [facility].  No family currently present at bedside.  CSW attempted to contacted patient daughter, however no answer and voicemail full.  CSW will continue to attempt to contact patient daughter to complete assessment and confirm patient discharge plans.    Laurie Ramirez, [place] [phone]

## 2013-07-15 NOTE — Progress Notes (Signed)
Physical Therapy Treatment Patient Details Name: Laurie Ramirez MRN: [id number] DOB: August 30, 1931 Today's Date: 07/15/2013 Time: 8295-6213 PT Time Calculation (min): 28 min  PT Assessment / Plan / Recommendation  History of Present Illness Laurie Ramirez is a 79 y.o. female with a history of Afib who was not on anticoagulation due to bruising who presented to [facility] on 11/14 with nausea and vomiting. She was being treated for presumed pancreatitis and renal failure felt to be due to nausea and vomiting.She was seen by nursing at 9:30pm and at that time appeared to be in her normal state talking and moving her right side.  Pt was given tPA. L MCA CVA   PT Comments   Pt continues to have flat affect with global aphasia and non verbal.  Pt showing right neglect with pushing to right side.  However pt able to visually pass midline to right side x 2 for brief moment while tracking to right side.   Follow Up Recommendations  Supervision/Assistance - 24 hour;SNF     Equipment Recommendations  Other (comment)    Frequency Min 4X/week   Progress towards PT Goals Progress towards PT goals: Progressing toward goals (slowly)  Plan Discharge plan needs to be updated    Precautions / Restrictions Precautions Precautions: Fall Precaution Comments: right hemiplegia; right neglect Restrictions Weight Bearing Restrictions: No   Pertinent Vitals/Pain Unable to rate, VSS    Mobility  Bed Mobility Supine to Sit: 1: +2 Total assist Supine to Sit: Patient Percentage: 10% Sitting - Scoot to Edge of Bed: 1: +1 Total assist Details for Bed Mobility Assistance: (A) to elevate trunk OOB with max cues for technique however no activation noted on right extremities however flexor withdrawl to pain.  Transfers Transfers: Sit to Stand;Stand to Sit;Squat Pivot Transfers Sit to Stand: 1: +2 Total assist;From bed Sit to Stand: Patient Percentage: 10% Stand to Sit: 1: +2 Total assist;To bed;To  chair/3-in-1 Stand to Sit: Patient Percentage: 10% Squat Pivot Transfers: 1: +2 Total assist;From elevated surface Squat Pivot Transfers: Patient Percentage: 10% Details for Transfer Assistance: Pt with mimial activation with LE to assist with stand.  Pt able to slight clear sacrum from surface to complete squat pivot.  Pt pushing to right in sitting and resisting transfer to left side therefore pt was transferred to right side.  Ambulation/Gait Ambulation/Gait Assistance: Not tested (comment) Modified Rankin (Stroke Patients Only) Pre-Morbid Rankin Score: No symptoms Modified Rankin: Severe disability    Exercises     PT Diagnosis:    PT Problem List:   PT Treatment Interventions:     PT Goals (current goals can now be found in the care plan section) Acute Rehab PT Goals PT Goal Formulation: Patient unable to participate in goal setting Time For Goal Achievement: 07/28/13 Potential to Achieve Goals: Fair  Visit Information  Last PT Received On: 07/15/13 Assistance Needed: +2 History of Present Illness: Laurie Ramirez is a 79 y.o. female with a history of Afib who was not on anticoagulation due to bruising who presented to [facility] on 11/14 with nausea and vomiting. She was being treated for presumed pancreatitis and renal failure felt to be due to nausea and vomiting.She was seen by nursing at 9:30pm and at that time appeared to be in her normal state talking and moving her right side.  Pt was given tPA. L MCA CVA    Subjective Data  Subjective: Non verbal   Cognition  Cognition Arousal/Alertness: Lethargic Behavior During Therapy:  Flat affect Overall Cognitive Status: Impaired/Different from baseline Area of Impairment: Attention;Following commands Current Attention Level: Focused Following Commands: Follows one step commands inconsistently General Comments: Pt with right neglect, right gaze preference and unable to shift past midline, pt nonverbal and only followed grossly  5% of commands given with multimodal commands    Balance  Balance Balance Assessed: Yes Static Sitting Balance Static Sitting - Balance Support: Left upper extremity supported;No upper extremity supported;Feet supported Static Sitting - Level of Assistance: 2: Max assist;3: Mod assist Static Sitting - Comment/# of Minutes: Pt needed (A) to maintain upright posture and midline. Pt continued to lean forward and pushing towards right side.   End of Session PT - End of Session Equipment Utilized During Treatment: Gait belt Activity Tolerance: Patient tolerated treatment well Patient left: in chair;with call bell/phone within reach Nurse Communication: Mobility status;Need for lift equipment   GP     Daquawn Seelman 07/15/2013, 12:31 PM  [place], PT DPT [phone]

## 2013-07-15 NOTE — Clinical Social Work Psychosocial (Signed)
Clinical Social Work Department BRIEF PSYCHOSOCIAL ASSESSMENT 07/15/2013  Patient:  ZOEIE, RITTER     Account Number:  [id number]     Admit date:  07/08/2013  Clinical Social Worker:  Sherre Lain  Date/Time:  07/15/2013 04:21 PM  Referred by:  Physician  Date Referred:  07/15/2013 Referred for  SNF Placement   Other Referral:   none.   Interview type:  Family Other interview type:   CSW spoke with pt's daughter, Bobbye Morton. [phone]    PSYCHOSOCIAL DATA Living Status:  FAMILY Admitted from facility:   Level of care:   Primary support name:  Melody Alona Bene Primary support relationship to patient:  CHILD, ADULT Degree of support available:   Strong support system.    CURRENT CONCERNS Current Concerns  Post-Acute Placement   Other Concerns:   none.    SOCIAL WORK ASSESSMENT / PLAN CSW spoke with pt's daughter, Bobbye Morton, via phone. CSW explained CSW role at [facility], and Melody was agreeable to speaking with CSW. Melody stated that prior to admission to [facility], pt was living at home with her two daughters (Melody included) and a son-in-law Cordelia Pen and Laurena Slimmer). Melody stated that she would like for her mother to be placed at [facility] because they have a family member that works at the facility. Melody confirmed that if able, pt will be returning home once therapy is complete. Melody stated that pt currently has a walker at home, but would likely need a hospital bed and a ramp to get into their home. CSW stated that she was covering for [facility] CSW, but that was given report that [facility] CSW was actively working on placement for pt. Weekend CSW to continue to follow.   Assessment/plan status:  Psychosocial Support/Ongoing Assessment of Needs Other assessment/ plan:   none.   Information/referral to community resources:   CSW provided information to SNF placement process, family was set on a facility.    PATIENTS/FAMILYS RESPONSE TO PLAN OF CARE: Pt's  daughter, Melody, was understanding and agreeable to CSW plan of care.       Darlyn Chamber, LCSWA Clinical Social Worker [phone]

## 2013-07-15 NOTE — Progress Notes (Signed)
Pt has only voided since 0700. Bladder scan 0ml by 2 examiners. Reviewed labs, I and O with provider. Orders received including increasing IVF to 118ml/hr and maintain urinary catheter for accurate I and O.

## 2013-07-15 NOTE — Progress Notes (Signed)
UR completed.  Pt planned for d/c to SNF when able to take po Cardizem. CSW aware.   Carlyle Lipa, RN BSN MHA CCM Trauma/Neuro ICU Case Manager [phone]

## 2013-07-15 NOTE — Progress Notes (Signed)
Speech Language Pathology Treatment: Dysphagia;Cognitive-Linquistic  Patient Details Name: Laurie Ramirez MRN: [id number] DOB: 08/15/1932 Today's Date: 07/15/2013 Time: 1478-2956 SLP Time Calculation (min): 26 min  Assessment / Plan / Recommendation Clinical Impression  Pt demonstrates improvement in auditory comprehension and sustained attention to basic functional tasks with mod assist. Pt abel to self feed with hand over hand assist, wiped mouth and used lotion and chap stick. Identified familiar objects in a field of 2 with 80% accuracy. Tolerating pudding thick texture though a cough following consumption of entire ensure pudding cup slightly concerning. Will keep pt on dys 1/pudding thick liquids for now. Based on presentation today, hopeful that intake will be good.    HPI HPI: 79 year old female with a history of CHF, A. fib, presented to [facility] on 11/14 with nausea and vomiting. She was being treated for presumed pancreatitis and renal failure felt to be due to nausea and vomiting.  She was seen by nursing at 9:30pm and at that time appeared to be in her normal state talking and moving her right side. Possible apical thrombus on echo with signs and symptoms of a large left MCA infarct. She has received IV TPA.   Pertinent Vitals NA  SLP Plan  Continue with current plan of care    Recommendations Diet recommendations: Pudding-thick liquid;Dysphagia 1 (puree) Liquids provided via: Teaspoon Medication Administration: Crushed with puree Supervision: Staff to assist with self feeding Compensations: Slow rate;Small sips/bites;Check for anterior loss Postural Changes and/or Swallow Maneuvers: Seated upright 90 degrees              General recommendations: Rehab consult Oral Care Recommendations: Oral care BID Follow up Recommendations: Inpatient Rehab Plan: Continue with current plan of care    GO    [facility], MA CCC-SLP [phone]  Claudine Mouton 07/15/2013,  10:40 AM

## 2013-07-16 DIAGNOSIS — I4891 Unspecified atrial fibrillation: Secondary | ICD-10-CM

## 2013-07-16 DIAGNOSIS — I6350 Cerebral infarction due to unspecified occlusion or stenosis of unspecified cerebral artery: Secondary | ICD-10-CM

## 2013-07-16 LAB — BASIC METABOLIC PANEL
Calcium: 9.2 mg/dL (ref 8.4–10.5)
Chloride: 112 mEq/L (ref 96–112)
GFR calc Af Amer: 68 mL/min — ABNORMAL LOW (ref >90)
GFR calc non Af Amer: 58 mL/min — ABNORMAL LOW (ref >90)
Glucose, Bld: 156 mg/dL — ABNORMAL HIGH (ref 70–99)
Potassium: 3.6 mEq/L (ref 3.5–5.1)
Sodium: 150 mEq/L — ABNORMAL HIGH (ref 135–145)

## 2013-07-16 MED ORDER — METOPROLOL TARTRATE 50 MG PO TABS: 50.0000 mg | ORAL_TABLET | Freq: Two times a day (BID) | ORAL | Status: AC

## 2013-07-16 MED ORDER — AMIODARONE HCL 200 MG PO TABS: 200.0000 mg | ORAL_TABLET | Freq: Every day | ORAL | Status: AC

## 2013-07-16 MED ORDER — ATORVASTATIN CALCIUM 10 MG PO TABS: 10.0000 mg | ORAL_TABLET | Freq: Every day | ORAL | Status: DC

## 2013-07-16 MED ORDER — POTASSIUM CHLORIDE CRYS ER 20 MEQ PO TBCR: 20.0000 meq | EXTENDED_RELEASE_TABLET | Freq: Every day | ORAL | Status: DC

## 2013-07-16 MED ORDER — APIXABAN 5 MG PO TABS: 5.0000 mg | ORAL_TABLET | Freq: Two times a day (BID) | ORAL | Status: DC

## 2013-07-16 NOTE — Progress Notes (Signed)
Weekend CSW contacted [employer], Scientist, research (physical sciences), at [employer]. Per Admissions Coordinator, patient can be discharged to SNF if discharge summary received by 4 pm. Patient will need relative to sign her into facility. CSW attempted to contact daughters Melody and Cordelia Pen on file but could not get in touch with either.  CSW was able to speak with patient's niece Ann Held [phone]) regarding discharge plan and need for someone to sign patient into facility. Niece stated for CSW to call her when patient is en route to facility and she will have patient's daughter Melody meet her there to sign paperwork.  Samuella Bruin, MSW, LCSWA Clinical Social Worker [facility] Emergency Dept. [phone]

## 2013-07-16 NOTE — Progress Notes (Signed)
Report called at 1740 to [place]ante to  [facility]. Spoke to her for about 1 minute then she gave the phone to someone named  Rinaldo Cloud, who asked when the patient's last bowel movement was. I also told her the patients weight her mobility status and that she had a foley cath. This nurse had to go back in the record to find last BM which was  On 07/08/2013 at [facility]. Phone was handed back to [place] who proceeded to ask about the BM and if she could speak and walk. I told her that the BM was the 14th and that the other person pamela had taken down the other info. Misty said nothing was written down. So this nurse gave her the info again and after hearing foley, nonverbal and total care she said ok that's all I need.  Wasn't interested in hearing anymore of the report.

## 2013-07-16 NOTE — Discharge Summary (Signed)
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  Past Medical History  Diagnosis Date  . Essential hypertension, benign   . Atrial fibrillation   . Bronchitis   . Depression   . Chronic combined systolic and diastolic heart failure      LVEF 45-50%  . 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 Chronic systolic heart failure 06/18/2013   Past Surgical History  Procedure Laterality Date  . Shoulder surgery      right      Medication List    STOP taking these medications       aspirin EC 81 MG tablet      TAKE these medications       acetaminophen 500 MG tablet  Commonly known as:  TYLENOL  Take 1,000 mg by mouth every 6 (six) hours as needed for pain.     amiodarone 200 MG tablet  Commonly known as:  PACERONE  Take 1 tablet (200 mg total) by mouth daily.     apixaban 5 MG Tabs tablet  Commonly known as:  ELIQUIS  Take 1 tablet (5 mg total) by mouth 2 (two) times daily.     atorvastatin 10 MG tablet  Commonly known as:  LIPITOR  Take 1 tablet (10 mg total) by mouth daily at 6 PM.     DULoxetine 30 MG capsule  Commonly known as:  CYMBALTA  Take 30 mg by mouth daily.     furosemide 40 MG tablet   Commonly known as:  LASIX  Take 40 mg by mouth daily.     lisinopril-hydrochlorothiazide 20-12.5 MG per tablet  Commonly known as:  PRINZIDE,ZESTORETIC  Take 1 tablet by mouth daily.     metoprolol 50 MG tablet  Commonly known as:  LOPRESSOR  Take 50 mg by mouth 2 (two) times daily.     PATADAY 0.2 % Soln  Generic drug:  Olopatadine HCl  Apply 1 drop to eye daily as needed (allergies).     potassium chloride SA 20 MEQ tablet  Commonly known as:  K-DUR,KLOR-CON  Take 1 tablet (20 mEq total) by mouth daily.     ZOFRAN 4 MG tablet  Generic drug:  ondansetron  Take 4 mg by mouth every 8 (eight) hours as needed for nausea or vomiting.        LABORATORY STUDIES CBC    Component Value Date/Time   WBC 9.1 07/09/2013 0644   RBC 5.59* 07/09/2013 0644   HGB 17.1* 07/09/2013 0644   HCT 53.5* 07/09/2013 0644   PLT 196 07/09/2013 0644   MCV 95.7 07/09/2013 0644   MCH 30.6 07/09/2013 0644   MCHC 32.0 07/09/2013 0644   RDW 15.7* 07/09/2013 0644   LYMPHSABS 2.4 06/27/2013  1618   MONOABS 0.8 06/27/2013 1618   EOSABS 0.0 06/27/2013 1618   BASOSABS 0.0 06/27/2013 1618   CMP    Component Value Date/Time   NA 150* 07/16/2013 0615   K 3.6 07/16/2013 0615   CL 112 07/16/2013 0615   CO2 28 07/16/2013 0615   GLUCOSE 156* 07/16/2013 0615   BUN 22 07/16/2013 0615   CREATININE 0.91 07/16/2013 0615   CALCIUM 9.2 07/16/2013 0615   PROT 7.6 07/08/2013 1616   ALBUMIN 3.2* 07/08/2013 1616   AST 26 07/08/2013 1616   ALT 11 07/08/2013 1616   ALKPHOS 113 07/08/2013 1616   BILITOT 1.2 07/08/2013 1616   GFRNONAA 58* 07/16/2013 0615   GFRAA 68* 07/16/2013 0615   COAGS Lab Results  Component Value Date   INR 1.07 06/20/2013   Lipid Panel    Component Value Date/Time   CHOL 206* 07/12/2013 0440   TRIG 201* 07/12/2013 0440   HDL 35* 07/12/2013 0440   CHOLHDL 5.9 07/12/2013 0440   VLDL 40 07/12/2013 0440   LDLCALC 131* 07/12/2013 0440   HgbA1C  Lab Results  Component Value Date    HGBA1C 6.7* 07/12/2013   Cardiac Panel (last 3 results) No results found for this basename: CKTOTAL, CKMB, TROPONINI, RELINDX,  in the last 72 hours Urinalysis    Component Value Date/Time   COLORURINE AMBER* 07/15/2013 0434   APPEARANCEUR CLOUDY* 07/15/2013 0434   LABSPEC 1.021 07/15/2013 0434   PHURINE 5.5 07/15/2013 0434   GLUCOSEU 500* 07/15/2013 0434   HGBUR NEGATIVE 07/15/2013 0434   BILIRUBINUR SMALL* 07/15/2013 0434   KETONESUR NEGATIVE 07/15/2013 0434   PROTEINUR 30* 07/15/2013 0434   UROBILINOGEN 1.0 07/15/2013 0434   NITRITE NEGATIVE 07/15/2013 0434   LEUKOCYTESUR NEGATIVE 07/15/2013 0434   Urine Drug Screen  No results found for this basename: labopia, cocainscrnur, labbenz, amphetmu, thcu, labbarb    Alcohol Level No results found for this basename: eth     SIGNIFICANT DIAGNOSTIC STUDIES     History of Present Illness - this 79 year old female presented to [facility] on 07/08/2013 with nausea and vomiting. She was admitted and treated for presumed pancreatitis and renal failure felt to be secondary to her nausea and vomiting. She was gradually improving. On 07/12/2013 the patient's nurse discovered that the patient was not responding to questions. A stat CT of the head was ordered as it was believed that the patient had suffered a stroke. The patient was transferred to the intensive care unit at [facility] and a decision was made to treat with TPA. The patient was later transferred to [facility] where she was admitted by Dr. Amada Jupiter. At time of admission the patient was unable to speak and was flaccid on the right. A thrombectomy was contemplated; however, the patient's daughter could not be contacted in order to obtain consent.  Hospital Course - the patient was admitted by Dr. Amada Jupiter in transfer from [facility] with an acute stroke being treated with TPA. The patient has a history of atrial fibrillation but was not  anticoagulated due to a history of bruising. A 2-D echo was performed that revealed an ejection fraction of 45% with a probable left atrial thrombus. A decision was made to anticoagulate the patient with Eliquis. A CT scan of the head on 07/12/2013 revealed a large region of hypodensity in the left middle cerebral artery distribution compatible with a left middle cerebral artery infarct. There was no hemorrhage.  The patient was seen  by Dr. Charlton Haws from cardiology during her stay. As noted she has atrial fibrillation and her amiodarone had been discontinued at some point. It was recommended that the amiodarone be restarted for rate control. Dr. Eden Emms also agreed with starting Eliquis and increasing her Lasix.   A carotid duplex was performed on 07/12/2013 showed an apparent occlusion of the left common carotid artery secondary to a thrombus.  The physical therapists, occupational therapists, and speech therapists evaluated and worked with the patient. They recommended continued  therapies following discharge.  During the patient's stay she was noted to be hypokalemic and this was supplemented. Her magnesium was also low and required supplementation. This will need to be monitored further on an outpatient basis. Her renal insufficiency was improving.  The patient had been made NPO on admission secondary to concerns regarding dysphagia. The speech therapist worked with the patient on her swallowing throughout her stay. A barium swallow was performed on 07/14/2013 and the patient was advanced to a dysphagia 1 (Puree) diet with pudding thick liquids. Her medications were crushed and placed in the pure.  On 07/16/2013 a bed was obtained at a skilled nursing facility. Arrangements were made to discharge the patient. Her medications were adjusted prior to discharge. Her potassium supplement was decreased as her potassium was improving. These will need further monitoring.  The patient was discharged in  stable condition.  Patient with vascular risk factors of:   Atrial fibrillation  Hypertension history  Hyperlipidemia  Apical thrombus  Patient with continued stroke symptoms of aphasia, dysphagia and right hemiplegia.Marland Kitchen Physical therapy, occupational therapy and speech therapy evaluated patient. They recommend continued therapies.  Discharge Exam  Blood pressure 124/104, pulse 61, temperature 98.5 F (36.9 C), temperature source Oral, resp. rate 26, height 5\' 4"  (1.626 m), weight 97.6 kg (215 lb 2.7 oz), SpO2 96.00%.  CARDIOVASCULAR:  IRREG RATE AND RHYTHM. No murmurs, no carotid bruits. NO SUPRACLAVICULAR BRUITS. DECR RADIAL PULSES BILATERALLY.  NEUROLOGIC:  MENTAL STATUS: awake; severe expressive and receptive aphasia. FOLLOWS SIMPLE COMMANDS (STICK TONGUE, HOLD UP ARM).  CRANIAL NERVE: pupils equal and reactive to light, BLINKS TO THREAT ON LEFT, NO BLINK TO THREAT ON RIGHT. LEFT GAZE DEVIATION, WITH DOLL'S EYES REFLEX PRESENT TO MIDLINE. No nystagmus, DECR RIGHT FACIAL STRENGTH.  MOTOR: RUE AND RLE 0/5. MINIMAL WITHDRAWAL IN RLE TO STIM. LUE 3-4/5, LLE 3/5.  SENSORY: WITHDRAWS ON LUE, LLE, RLE.  GAIT/STATION: IN BED.     Discharge Diet    dysphagia 1 (Puree) diet with pudding thick liquids. Her medications were crushed and placed in the puree.  Discharge Plan    Disposition:  Stable - discharge to skilled nursing facility  Eliquis for secondary stroke prevention.  Ongoing risk factor control by Primary Care Physician. Risk factor recommendations:  Hypertension target range 130-140/70-80 Lipid range - LDL < 100 and checked every 6 months, fasting   Follow-up GOLDING, Chancy Hurter, MD in 1 month.  Follow-up with Dr. Delia Heady, Stroke Clinic in 2 months.  Greater than 30 minutes were spent preparing discharge.  Signed Delton See PA-C [facility] Pager [phone] 07/16/2013, 2:14 PM  I have personally examined this patient, reviewed pertinent  data and developed the plan of care. I agree with above. Delia Heady, MD

## 2013-07-16 NOTE — Progress Notes (Signed)
Stroke Team Progress Note  HISTORY Laurie Ramirez is a 79 y.o. female with a history of Afib who was not on anticoagulation due to bruising who presented to [facility] on 11/14 with nausea and vomiting. She was being treated for presumed pancreatitis and renal failure felt to be due to nausea and vomiting. Her creatinine at baseline is around 1.15 and was 3.16 on admission. Currently trending down to 1.75 this morning.   She was seen by nursing at 9:30pm and at that time appeared to be in her normal state talking and moving her right side.  Of note, there is discussion of starting apixaban, but there is no documentation of a dose given and therefore t-PA was given. TPA was started at 1:49 AM. Patient transferred from [facility].  Echo had shown possible apical thrombus. Patient in afib (not on anticoagulation due to bruising). Eliquis contemplated; however, had not started.  As per Dr. Amada Jupiter on admission: The patient received IV tpa and was transferred from [facility]. Phone numbers in chart were wrong, and subsequently once the correct number for the daughter was found, went right to voice mail. We had the sheriff try to locate the daughter for consent, but by the time I was able to get a hold of her there was not enough time to get team and then have a reasonable expectation of being able to perform a thrombectomy, so was not done.  SUBJECTIVE More awake. Feels better. She is off cardizem drip now and amiodarone and po cardizem started y`day. Social worker is awaiting family decision on moving her to SNF    OBJECTIVE Most recent Vital Signs: Filed Vitals:   07/16/13 0400 07/16/13 0500 07/16/13 0600 07/16/13 0700  BP: 159/96 165/84 149/100 153/59  Pulse: 115 126 111   Temp:      TempSrc:      Resp: 27 26 26  36  Height:      Weight:      SpO2: 98% 97% 94% 97%   CBG (last 3)  No results found for this basename: GLUCAP,  in the last 72 hours  IV Fluid Intake:   . sodium chloride  100 mL/hr at 07/16/13 0833  . diltiazem (CARDIZEM) infusion 15 mg/hr (07/16/13 0549)    MEDICATIONS  . sodium chloride  250 mL Intravenous Once  . amiodarone  200 mg Oral Daily  . apixaban  5 mg Oral BID  . atorvastatin  10 mg Oral q1800  . diltiazem  5 mg Intravenous Once  . metoprolol tartrate  50 mg Oral BID  . pantoprazole  40 mg Oral Q0600  . potassium chloride  40 mEq Oral BID   PRN:  acetaminophen, acetaminophen, labetalol  Diet:  Dysphagia 1 Activity:  Bedrest DVT Prophylaxis:  SCD  CLINICALLY SIGNIFICANT STUDIES Basic Metabolic Panel:   Recent Labs Lab 07/14/13 0330 07/15/13 0329 07/16/13 0615  NA 145 148* 150*  K 2.9* 3.1* 3.6  CL 105 109 112  CO2 26 29 28   GLUCOSE 174* 138* 156*  BUN 20 18 22   CREATININE 0.97 0.82 0.91  CALCIUM 9.1 9.3 9.2  MG 1.2* 2.1  --    Liver Function Tests:  No results found for this basename: AST, ALT, ALKPHOS, BILITOT, PROT, ALBUMIN,  in the last 168 hours CBC:  No results found for this basename: WBC, NEUTROABS, HGB, HCT, MCV, PLT,  in the last 168 hours Coagulation: No results found for this basename: LABPROT, INR,  in the last 168  hours Cardiac Enzymes: No results found for this basename: CKTOTAL, CKMB, CKMBINDEX, TROPONINI,  in the last 168 hours Urinalysis:   Recent Labs Lab 07/15/13 0434  COLORURINE AMBER*  LABSPEC 1.021  PHURINE 5.5  GLUCOSEU 500*  HGBUR NEGATIVE  BILIRUBINUR SMALL*  KETONESUR NEGATIVE  PROTEINUR 30*  UROBILINOGEN 1.0  NITRITE NEGATIVE  LEUKOCYTESUR NEGATIVE   Lipid Panel    Component Value Date/Time   CHOL 206* 07/12/2013 0440   TRIG 201* 07/12/2013 0440   HDL 35* 07/12/2013 0440   CHOLHDL 5.9 07/12/2013 0440   VLDL 40 07/12/2013 0440   LDLCALC 131* 07/12/2013 0440   HgbA1C  Lab Results  Component Value Date   HGBA1C 6.7* 07/12/2013    Urine Drug Screen:   No results found for this basename: labopia,  cocainscrnur,  labbenz,  amphetmu,  thcu,  labbarb    Alcohol Level: No  results found for this basename: ETH,  in the last 168 hours  Ct Head Wo Contrast 07/12/2013  : 1. Periventricular white matter and corona radiata hypodensities favor chronic ischemic microvascular white matter disease. No definite acute intracranial findings.   11/18/20141. Large region of hypodensity in the left MCA distribution  compatible with MCA infarct. No hemorrhagic transformation observed.   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  Carotid Doppler   Carotid Duplex (Doppler) has been completed. Preliminary findings: Technically difficult due to patient movement.  Right = 1-39% ICA stenosis.  Left = Apparent occlusion of Common carotid artery from thrombus. Limited flow noted in external carotid, no flow noted in internal carotid  CXR    EKG  atrial fibrillation, rate 70s.   Therapy Recommendations   GENERAL EXAM: Patient is in no distress  CARDIOVASCULAR: IRREG RATE AND RHYTHM. No murmurs, no carotid bruits. NO SUPRACLAVICULAR BRUITS. DECR RADIAL PULSES BILATERALLY.  NEUROLOGIC: MENTAL STATUS: awake; severe expressive and receptive aphasia. FOLLOWS SIMPLE COMMANDS (STICK TONGUE, HOLD UP ARM).  CRANIAL NERVE: pupils equal and reactive to light, BLINKS TO THREAT ON LEFT, NO BLINK TO THREAT ON RIGHT. LEFT GAZE DEVIATION, WITH DOLL'S EYES REFLEX PRESENT TO MIDLINE. No nystagmus, DECR RIGHT FACIAL STRENGTH.  MOTOR: RUE AND RLE 0/5. MINIMAL WITHDRAWAL IN RLE TO STIM. LUE 3-4/5, LLE 3/5.  SENSORY: WITHDRAWS ON LUE, LLE, RLE.  GAIT/STATION: IN BED.    ASSESSMENT Laurie Ramirez is a 79 y.o. female presenting with right hemiparesis. Status post IV t-PA 07/12/2013 at 0149. CT imaging shows chronic ischemic microvascular white matter disease, Large  region of hypodensity in the left MCA distribution compatible with MCA infarct . 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  On aspirin 81 mg orally every day prior to admission. Now on rectal aspirin 300mg  daily for secondary stroke prevention. Patient with resultant right hemiparesis. 07/14/2013 patient had increasing   Hyperlipidemia, LDL 131, goal <100  Abnormal urine, u/a requested with culture if positive  Apical thrombus, eliquis recommended per cardiology  Atrial fibrillation, now with RVR, ask cardiology to assist with control as she has been on amiodarone recently.  Chronic systolic heart failure  Hypertension  HBG A1C  6.7  Acute renal insufficiency improving. Chronic  renal insufficiency cannot clinically determine stateg  Carotid stenosis, left carotid occlusion  Hypokalemic, replace  Hypomagnesemia, replaced and resolved  Hospital day # 8  TREATMENT/PLAN  Continue eliquis for atrial fib and amiodarone and cardizem for rate control  Continue statin  Risk factor modification  Passed swallow  Replete potassium  CIR consulted. Patient wants to go to [facility] [place] for rehab.  Continue iv fluids for mild prerenal azotemia likley pre renal  Transfer to floor today and to SNF rehab when family approves     Delia Heady, MD  07/16/2013 9:43 AM

## 2013-07-16 NOTE — Progress Notes (Signed)
Weekend CSW attempted to page attending MD to discuss patient discharge to SNF. CSW contacted patient's RN Inetta Fermo to relay information that patient can be discharged to [facility] when medically ready.  Samuella Bruin, MSW, LCSWA Clinical Social Worker [facility] Emergency Dept. [phone]

## 2013-07-16 NOTE — Progress Notes (Signed)
Weekend CSW contacted Marta Lamas, Admissions Coordinator, from [facility] in [place] to confirm bed offer and possible weekend admission. Admissions Coordinator to review patient's paperwork and call CSW back.  CSW attempted to contact patient's daughters Melody (no answer, voicemail full) & Cordelia Pen (no answer, left voicemail) regarding bed offer from [facility].  Samuella Bruin, MSW, LCSWA Clinical Social Worker [facility] Emergency Dept. [phone]

## 2013-07-16 NOTE — Progress Notes (Signed)
Clinical Social Work Department CLINICAL SOCIAL WORK PLACEMENT NOTE 07/16/2013  Patient:  Laurie Ramirez, Laurie Ramirez  Account Number:  [id number] Admit date:  07/08/2013  Clinical Social Worker:  Samuella Bruin, Theresia Majors  Date/time:  07/16/2013 08:47 AM  Clinical Social Work is seeking post-discharge placement for this patient at the following level of care:   SKILLED NURSING   (*CSW will update this form in Epic as items are completed)   07/15/2013  Patient/family provided with [facility] Department of Clinical Social Work's list of facilities offering this level of care within the geographic area requested by the patient (or if unable, by the patient's family).  07/15/2013  Patient/family informed of their freedom to choose among providers that offer the needed level of care, that participate in Medicare, Medicaid or managed care program needed by the patient, have an available bed and are willing to accept the patient.    Patient/family informed of [facility]' ownership interest in [facility], as well as of the fact that they are under no obligation to receive care at this facility.  PASARR submitted to EDS on 07/15/2013 PASARR number received from EDS on 07/15/2013  FL2 transmitted to all facilities in geographic area requested by pt/family on  07/15/2013 FL2 transmitted to all facilities within larger geographic area on   Patient informed that his/her managed care company has contracts with or will negotiate with  certain facilities, including the following:     Patient/family informed of bed offers received:  07/16/2013 Patient chooses bed at [facility] [place] Physician recommends and patient chooses bed at    Patient to be transferred to  on   Patient to be transferred to facility by   The following physician request were entered in Epic:   Additional Comments:  Samuella Bruin, MSW, LCSWA Clinical Social Worker [facility] Emergency Dept. [phone]

## 2013-07-16 NOTE — Progress Notes (Signed)
Weekend CSW provided RN with discharge packet and telephone number to call report to. CSW confirmed arrival with [facility] and niece Tinka. Patient is being discharged to [facility] in [place]. CSW signing off, please reconsult if further social work needs arise.  Samuella Bruin, MSW, LCSWA Clinical Social Worker [facility]. [phone] '

## 2013-07-16 NOTE — Progress Notes (Signed)
Weekend CSW spoke with Marta Lamas, Admissions Coordinator [phone], from [facility] in [place]. Per Admissions Coordinator, facility can receive patient over weekend when discharged.   CSW contacted Elease Etienne [phone]) to confirm patient discharge to [facility].   Samuella Bruin, MSW, LCSWA Clinical Social Worker [facility]. [phone]

## 2013-07-16 NOTE — Progress Notes (Signed)
[facility] here to transport want MD called Due to bp and HR. Dr. Leroy Kennedy text paged and am awaiting return call. Paged again at 1811 returned call and discussed history and vital signs and Dr. Leroy Kennedy said she could go to the SNF. Related this information to [place] attendants.

## 2013-07-16 NOTE — Progress Notes (Signed)
Called Dr. Pearlean Brownie on cell number in amion notified of need for discharge summary by 1500 hours , will have Theodoro Grist Rinehuls complete the D/C summary. Christin with social work also notifed and she will be on the watch for it.

## 2013-07-16 NOTE — Progress Notes (Signed)
Received call from christy social worker in reference to transfer to SNF in [place] today if received by 1700 hours. Paged Dr. Pearlean Brownie on [facility] and asked if he could have discharge summary done by 1500 hours. Awaiting return call.

## 2013-07-17 ENCOUNTER — Inpatient Hospital Stay (HOSPITAL_COMMUNITY)
Admission: EM | Admit: 2013-07-17 | Discharge: 2013-07-29 | DRG: 871 | Disposition: A | Discharge status: Home / Self Care | Payer: Medicare Other | Attending: Internal Medicine | Admitting: Internal Medicine

## 2013-07-17 ENCOUNTER — Emergency Department (HOSPITAL_COMMUNITY): Payer: Medicare Other

## 2013-07-17 ENCOUNTER — Other Ambulatory Visit: Payer: Self-pay

## 2013-07-17 ENCOUNTER — Encounter (HOSPITAL_COMMUNITY): Payer: Self-pay | Admitting: Emergency Medicine

## 2013-07-17 DIAGNOSIS — E860 Dehydration: Secondary | ICD-10-CM

## 2013-07-17 DIAGNOSIS — I69920 Aphasia following unspecified cerebrovascular disease: Secondary | ICD-10-CM

## 2013-07-17 DIAGNOSIS — I5043 Acute on chronic combined systolic (congestive) and diastolic (congestive) heart failure: Secondary | ICD-10-CM | POA: Diagnosis present

## 2013-07-17 DIAGNOSIS — I5022 Chronic systolic (congestive) heart failure: Secondary | ICD-10-CM

## 2013-07-17 DIAGNOSIS — R627 Adult failure to thrive: Secondary | ICD-10-CM | POA: Diagnosis present

## 2013-07-17 DIAGNOSIS — I5033 Acute on chronic diastolic (congestive) heart failure: Secondary | ICD-10-CM

## 2013-07-17 DIAGNOSIS — F3289 Other specified depressive episodes: Secondary | ICD-10-CM | POA: Diagnosis present

## 2013-07-17 DIAGNOSIS — E870 Hyperosmolality and hypernatremia: Secondary | ICD-10-CM

## 2013-07-17 DIAGNOSIS — F329 Major depressive disorder, single episode, unspecified: Secondary | ICD-10-CM | POA: Diagnosis present

## 2013-07-17 DIAGNOSIS — N390 Urinary tract infection, site not specified: Secondary | ICD-10-CM

## 2013-07-17 DIAGNOSIS — D72829 Elevated white blood cell count, unspecified: Secondary | ICD-10-CM

## 2013-07-17 DIAGNOSIS — A419 Sepsis, unspecified organism: Principal | ICD-10-CM

## 2013-07-17 DIAGNOSIS — I129 Hypertensive chronic kidney disease with stage 1 through stage 4 chronic kidney disease, or unspecified chronic kidney disease: Secondary | ICD-10-CM | POA: Diagnosis present

## 2013-07-17 DIAGNOSIS — I4891 Unspecified atrial fibrillation: Secondary | ICD-10-CM

## 2013-07-17 DIAGNOSIS — E785 Hyperlipidemia, unspecified: Secondary | ICD-10-CM | POA: Diagnosis present

## 2013-07-17 DIAGNOSIS — I639 Cerebral infarction, unspecified: Secondary | ICD-10-CM

## 2013-07-17 DIAGNOSIS — Z66 Do not resuscitate: Secondary | ICD-10-CM | POA: Diagnosis present

## 2013-07-17 DIAGNOSIS — Z8249 Family history of ischemic heart disease and other diseases of the circulatory system: Secondary | ICD-10-CM

## 2013-07-17 DIAGNOSIS — Z9181 History of falling: Secondary | ICD-10-CM

## 2013-07-17 DIAGNOSIS — T502X5A Adverse effect of carbonic-anhydrase inhibitors, benzothiadiazides and other diuretics, initial encounter: Secondary | ICD-10-CM | POA: Diagnosis present

## 2013-07-17 DIAGNOSIS — N179 Acute kidney failure, unspecified: Secondary | ICD-10-CM

## 2013-07-17 DIAGNOSIS — I509 Heart failure, unspecified: Secondary | ICD-10-CM

## 2013-07-17 DIAGNOSIS — I513 Intracardiac thrombosis, not elsewhere classified: Secondary | ICD-10-CM

## 2013-07-17 DIAGNOSIS — I10 Essential (primary) hypertension: Secondary | ICD-10-CM

## 2013-07-17 DIAGNOSIS — K5900 Constipation, unspecified: Secondary | ICD-10-CM

## 2013-07-17 DIAGNOSIS — N189 Chronic kidney disease, unspecified: Secondary | ICD-10-CM | POA: Diagnosis present

## 2013-07-17 DIAGNOSIS — Z515 Encounter for palliative care: Secondary | ICD-10-CM

## 2013-07-17 DIAGNOSIS — E876 Hypokalemia: Secondary | ICD-10-CM

## 2013-07-17 LAB — BASIC METABOLIC PANEL
BUN: 31 mg/dL — ABNORMAL HIGH (ref 6–23)
CO2: 25 mEq/L (ref 19–32)
Calcium: 10 mg/dL (ref 8.4–10.5)
Chloride: 111 mEq/L (ref 96–112)
Creatinine, Ser: 0.96 mg/dL (ref 0.50–1.10)
GFR calc Af Amer: 64 mL/min — ABNORMAL LOW (ref >90)
Glucose, Bld: 155 mg/dL — ABNORMAL HIGH (ref 70–99)
Potassium: 4 mEq/L (ref 3.5–5.1)
Sodium: 147 mEq/L — ABNORMAL HIGH (ref 135–145)

## 2013-07-17 LAB — URINALYSIS, ROUTINE W REFLEX MICROSCOPIC
Glucose, UA: NEGATIVE mg/dL
Ketones, ur: NEGATIVE mg/dL
Protein, ur: 30 mg/dL — AB
Specific Gravity, Urine: 1.025 (ref 1.005–1.030)
Urobilinogen, UA: 1 mg/dL (ref 0.0–1.0)

## 2013-07-17 LAB — TROPONIN I: Troponin I: <0.3 ng/mL (ref <0.30)

## 2013-07-17 LAB — MRSA PCR SCREENING: MRSA by PCR: POSITIVE — AB

## 2013-07-17 LAB — CBC
HCT: 49.2 % — ABNORMAL HIGH (ref 36.0–46.0)
Hemoglobin: 15.6 g/dL — ABNORMAL HIGH (ref 12.0–15.0)
MCH: 30.5 pg (ref 26.0–34.0)
MCV: 96.3 fL (ref 78.0–100.0)
Platelets: 226 10*3/uL (ref 150–400)
RBC: 5.11 MIL/uL (ref 3.87–5.11)
WBC: 11.3 10*3/uL — ABNORMAL HIGH (ref 4.0–10.5)

## 2013-07-17 LAB — URINE MICROSCOPIC-ADD ON

## 2013-07-17 MED ORDER — DILTIAZEM HCL 25 MG/5ML IV SOLN
10.0000 mg | Freq: Once | INTRAVENOUS | Status: AC
  Administered 2013-07-17: 10 mg via INTRAVENOUS
  Filled 2013-07-17: qty 5

## 2013-07-17 MED ORDER — AMIODARONE HCL 200 MG PO TABS
200.0000 mg | ORAL_TABLET | Freq: Once | ORAL | Status: DC
  Filled 2013-07-17: qty 1

## 2013-07-17 MED ORDER — ACETAMINOPHEN 650 MG RE SUPP
RECTAL | Status: AC
  Administered 2013-07-17: 650 mg via RECTAL
  Filled 2013-07-17: qty 1

## 2013-07-17 MED ORDER — ACETAMINOPHEN 650 MG RE SUPP
650.0000 mg | Freq: Once | RECTAL | Status: AC
  Administered 2013-07-17: 650 mg via RECTAL

## 2013-07-17 MED ORDER — METOPROLOL TARTRATE 50 MG PO TABS
50.0000 mg | ORAL_TABLET | Freq: Once | ORAL | Status: DC
  Filled 2013-07-17: qty 1

## 2013-07-17 MED ORDER — DILTIAZEM HCL 100 MG IV SOLR
5.0000 mg/h | INTRAVENOUS | Status: DC
  Administered 2013-07-17: 15 mg/h via INTRAVENOUS
  Administered 2013-07-17: 5 mg/h via INTRAVENOUS
  Administered 2013-07-18 (×3): 15 mg/h via INTRAVENOUS
  Filled 2013-07-17: qty 100

## 2013-07-17 MED ORDER — PIPERACILLIN-TAZOBACTAM 3.375 G IVPB 30 MIN
3.3750 g | Freq: Once | INTRAVENOUS | Status: AC
  Administered 2013-07-17: 3.375 g via INTRAVENOUS
  Filled 2013-07-17 (×2): qty 50

## 2013-07-17 MED ORDER — DILTIAZEM HCL 25 MG/5ML IV SOLN
10.0000 mg | Freq: Once | INTRAVENOUS | Status: AC
  Administered 2013-07-17: 10 mg via INTRAVENOUS

## 2013-07-17 MED ORDER — FUROSEMIDE 10 MG/ML IJ SOLN
40.0000 mg | Freq: Once | INTRAMUSCULAR | Status: AC
  Administered 2013-07-17: 40 mg via INTRAVENOUS
  Filled 2013-07-17: qty 4

## 2013-07-17 NOTE — ED Notes (Signed)
bp high. wickline aware. vo to give bolus 10mg  of cardizem.

## 2013-07-17 NOTE — ED Provider Notes (Signed)
CSN: [id number]     Arrival date & time 07/17/13  1720 History  This chart was scribed for Joya Gaskins, MD by Quintella Reichert, ED scribe.  This patient was seen in room APA19/APA19 and the patient's care was started at 5:35 PM.   Chief Complaint  Patient presents with  . Shortness of Breath    Patient is a 79 y.o. female presenting with shortness of breath. The history is provided by the patient. No language interpreter was used.  Shortness of Breath Severity:  Moderate Timing:  Constant Progression:  Worsening Chronicity:  New Relieved by:  Nothing Worsened by:  Nothing tried   Level V Caveat: AMS  HPI Comments: Laurie Ramirez is a 79 y.o. female brought from [facility] to the Emergency Department for SOB.  Pt will not answer questions and so is not able to provide any history. Time of onset is unknown No other details are known at arrival  Past Medical History  Diagnosis Date  . Essential hypertension, benign   . Atrial fibrillation   . Bronchitis   . Depression   . Chronic combined systolic and diastolic heart failure      LVEF 45-50%  . 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 Chronic systolic heart failure 06/18/2013    Past Surgical History  Procedure Laterality Date  . Shoulder surgery      right    Family History  Problem Relation Age of Onset  . Heart failure Mother     History  Substance Use Topics  . Smoking status: Never Smoker   . Smokeless tobacco: Not on file  . Alcohol Use: No    OB History   Grav Para Term Preterm Abortions TAB SAB Ect Mult Living                  Review of Systems  Unable to perform ROS: Other  unable to perform due to AMS   Allergies  Review of patient's allergies indicates no known allergies.  Home Medications   Current Outpatient Rx  Name  Route  Sig  Dispense  Refill  . amiodarone (PACERONE) 200 MG tablet   Oral   Take 1 tablet (200 mg total) by mouth daily.   30 tablet   1   . apixaban  (ELIQUIS) 5 MG TABS tablet   Oral   Take 1 tablet (5 mg total) by mouth 2 (two) times daily.   60 tablet   1   . DULoxetine (CYMBALTA) 30 MG capsule   Oral   Take 30 mg by mouth daily.         . furosemide (LASIX) 40 MG tablet   Oral   Take 40 mg by mouth daily.          Marland Kitchen lisinopril-hydrochlorothiazide (PRINZIDE,ZESTORETIC) 20-12.5 MG per tablet   Oral   Take 1 tablet by mouth daily.         . metoprolol (LOPRESSOR) 50 MG tablet   Oral   Take 1 tablet (50 mg total) by mouth 2 (two) times daily.   60 tablet   1   . potassium chloride SA (K-DUR,KLOR-CON) 20 MEQ tablet   Oral   Take 1 tablet (20 mEq total) by mouth daily.   30 tablet   0   . senna (SENOKOT) 8.6 MG tablet   Oral   Take 2 tablets by mouth 2 (two) times daily.         Marland Kitchen acetaminophen (TYLENOL) 500 MG  tablet   Oral   Take 1,000 mg by mouth every 6 (six) hours as needed for pain.         Marland Kitchen atorvastatin (LIPITOR) 10 MG tablet   Oral   Take 1 tablet (10 mg total) by mouth daily at 6 PM.   30 tablet   1   . Olopatadine HCl (PATADAY) 0.2 % SOLN   Ophthalmic   Apply 1 drop to eye daily as needed (allergies).         . ondansetron (ZOFRAN) 4 MG tablet   Oral   Take 4 mg by mouth every 8 (eight) hours as needed for nausea or vomiting.          BP 146/95  Pulse 172  Temp(Src) 101.5 F (38.6 C) (Rectal)  Resp 32  SpO2 98% BP 135/89  Pulse 172  Temp(Src) 101.5 F (38.6 C) (Rectal)  Resp 21  SpO2 98%   Physical Exam  Nursing note and vitals reviewed. CONSTITUTIONAL: Elderly, ill-appearing, confused HEAD: Normocephalic/atraumatic ENMT: Mucous membranes moist NECK: supple no meningeal signs SPINE:entire spine nontender CV: tachycardic and irregular LUNGS: coarse breath sounds bilaterally, tachypneic. no apparent distress ABDOMEN: soft, nontender, no rebound or guarding GU:no cva tenderness.  Foley catheter in place on arrival. NEURO: Pt is awake, but does not follow commands.   S/P recent CVA. EXTREMITIES: pulses normal, full ROM SKIN: warm, color normal, no rash noted to extremities/abdomen.  Healed bruises to abdomen.   PSYCH: unable to assess     ED Course  Procedures   CRITICAL CARE Performed by: Joya Gaskins Total critical care time: 33 Critical care time was exclusive of separately billable procedures and treating other patients. Critical care was necessary to treat or prevent imminent or life-threatening deterioration. Critical care was time spent personally by me on the following activities: development of treatment plan with patient and/or surrogate as well as nursing, discussions with consultants, evaluation of patient's response to treatment, examination of patient, obtaining history from patient or surrogate, ordering and performing treatments and interventions, ordering and review of laboratory studies, ordering and review of radiographic studies, pulse oximetry and re-evaluation of patient's condition.   DIAGNOSTIC STUDIES: Oxygen Saturation is 98% on [place], normal by my interpretation.    COORDINATION OF CARE: 5:41 PM-Communication limited by condition of pt.  Will order cardiac workup. 6:42 PM Pt with h/o recent CVA (received TPA) and still has severe aphasia She is here with afib with RVR.   She was tachypneic initially, but this is improved She is noted to be febrile (foley in place on arrival from nursing home) Pt will need admission for rate control of her afib as well as further evaluation of her fever/sepsis Not responding to initial cardizem gtt as rate >130 Lasix given for CHF Cultures sent, broad spectrum abx ordered D/w dr Vanessa Barbara, will admit to stepdown  Labs Review Labs Reviewed  CBC - Abnormal; Notable for the following:    WBC 11.3 (*)    Hemoglobin 15.6 (*)    HCT 49.2 (*)    All other components within normal limits  BASIC METABOLIC PANEL - Abnormal; Notable for the following:    Sodium 147 (*)    Glucose, Bld  155 (*)    BUN 31 (*)    GFR calc non Af Amer 55 (*)    GFR calc Af Amer 64 (*)    All other components within normal limits  CULTURE, BLOOD (ROUTINE X 2)  CULTURE, BLOOD (ROUTINE X 2)  URINE CULTURE  TROPONIN I  URINALYSIS, ROUTINE W REFLEX MICROSCOPIC  LACTIC ACID, PLASMA    Imaging Review Dg Chest Portable 1 View  07/17/2013   CLINICAL DATA:  Atrial fibrillation, tachypnea, shortness of breath  EXAM: PORTABLE CHEST - 1 VIEW  COMPARISON:  07/12/2013  FINDINGS: Moderate cardiac enlargement. Aortic arch calcification. Mild vascular congestion and interstitial prominence. No consolidation or effusion.  IMPRESSION: Cardiomegaly with vascular congestion.  Minimal interstitial edema.   Electronically Signed   By: Esperanza Heir M.D.   On: 07/17/2013 18:00    EKG Interpretation   None      Date: 07/17/2013  Rate: 183  Rhythm: atrial fibrillation  QRS Axis: normal  Intervals: normal  ST/T Wave abnormalities: nonspecific ST changes  Conduction Disutrbances:right bundle branch block  Narrative Interpretation:   Old EKG Reviewed: RBBB seen in prior    MDM   1. Atrial fibrillation with rapid ventricular response   2. Sepsis   3. CHF (congestive heart failure)   4. Dehydration    Nursing notes including past medical history and social history reviewed and considered in documentation xrays reviewed and considered Labs/vital reviewed and considered Previous records reviewed and considered - h/o recent CVA       I personally performed the services described in this documentation, which was scribed in my presence. The recorded information has been reviewed and is accurate.     Joya Gaskins, MD 07/17/13 [phone]

## 2013-07-17 NOTE — ED Notes (Signed)
Pt in a fib. Pt hx of aphasia. 150-180 per ems. Arrived alert, lethargic. Will look at who is speaking. Tries to follow commands. Will no answer questions by voice or shaking head.

## 2013-07-17 NOTE — ED Notes (Signed)
cardizem up to 15mg /hr

## 2013-07-18 DIAGNOSIS — I5189 Other ill-defined heart diseases: Secondary | ICD-10-CM

## 2013-07-18 DIAGNOSIS — I5033 Acute on chronic diastolic (congestive) heart failure: Secondary | ICD-10-CM

## 2013-07-18 LAB — CBC
MCH: 31.1 pg (ref 26.0–34.0)
MCHC: 31.9 g/dL (ref 30.0–36.0)
MCV: 97.6 fL (ref 78.0–100.0)
Platelets: 207 10*3/uL (ref 150–400)
RDW: 15.5 % (ref 11.5–15.5)

## 2013-07-18 LAB — COMPREHENSIVE METABOLIC PANEL
AST: 49 U/L — ABNORMAL HIGH (ref 0–37)
Albumin: 2.3 g/dL — ABNORMAL LOW (ref 3.5–5.2)
CO2: 32 mEq/L (ref 19–32)
Calcium: 9.9 mg/dL (ref 8.4–10.5)
Creatinine, Ser: 1.05 mg/dL (ref 0.50–1.10)
GFR calc non Af Amer: 49 mL/min — ABNORMAL LOW (ref >90)
Sodium: 153 mEq/L — ABNORMAL HIGH (ref 135–145)
Total Bilirubin: 1.5 mg/dL — ABNORMAL HIGH (ref 0.3–1.2)
Total Protein: 7 g/dL (ref 6.0–8.3)

## 2013-07-18 MED ORDER — MUPIROCIN 2 % EX OINT
1.0000 "application " | TOPICAL_OINTMENT | Freq: Two times a day (BID) | CUTANEOUS | Status: AC
  Administered 2013-07-18 – 2013-07-22 (×10): 1 via NASAL
  Filled 2013-07-18 (×3): qty 22

## 2013-07-18 MED ORDER — VANCOMYCIN HCL IN DEXTROSE 1-5 GM/200ML-% IV SOLN
INTRAVENOUS | Status: AC
  Filled 2013-07-18: qty 200

## 2013-07-18 MED ORDER — CHLORHEXIDINE GLUCONATE CLOTH 2 % EX PADS
6.0000 | MEDICATED_PAD | Freq: Every day | CUTANEOUS | Status: AC
  Administered 2013-07-18 – 2013-07-22 (×3): 6 via TOPICAL

## 2013-07-18 MED ORDER — BIOTENE DRY MOUTH MT LIQD
15.0000 mL | Freq: Two times a day (BID) | OROMUCOSAL | Status: DC
  Administered 2013-07-18 – 2013-07-29 (×23): 15 mL via OROMUCOSAL

## 2013-07-18 MED ORDER — VANCOMYCIN HCL IN DEXTROSE 1-5 GM/200ML-% IV SOLN
1000.0000 mg | Freq: Once | INTRAVENOUS | Status: AC
  Administered 2013-07-19: 1000 mg via INTRAVENOUS
  Filled 2013-07-18: qty 200

## 2013-07-18 MED ORDER — ACETAMINOPHEN 500 MG PO TABS: 1000.0000 mg | ORAL_TABLET | Freq: Four times a day (QID) | ORAL | Status: DC | PRN

## 2013-07-18 MED ORDER — SODIUM CHLORIDE 0.9 % IV SOLN: 250.0000 mL | INTRAVENOUS | Status: DC | PRN

## 2013-07-18 MED ORDER — SODIUM CHLORIDE 0.9 % IV SOLN
INTRAVENOUS | Status: DC
  Administered 2013-07-21: 500 mL via INTRAVENOUS

## 2013-07-18 MED ORDER — POTASSIUM CHLORIDE CRYS ER 20 MEQ PO TBCR
20.0000 meq | EXTENDED_RELEASE_TABLET | Freq: Every day | ORAL | Status: DC
  Administered 2013-07-18: 20 meq via ORAL
  Filled 2013-07-18: qty 1

## 2013-07-18 MED ORDER — DILTIAZEM HCL ER COATED BEADS 240 MG PO CP24
240.0000 mg | ORAL_CAPSULE | Freq: Every day | ORAL | Status: DC
  Administered 2013-07-18 – 2013-07-23 (×6): 240 mg via ORAL
  Filled 2013-07-18 (×7): qty 1

## 2013-07-18 MED ORDER — ATORVASTATIN CALCIUM 10 MG PO TABS
10.0000 mg | ORAL_TABLET | Freq: Every day | ORAL | Status: DC
  Administered 2013-07-18 – 2013-07-27 (×9): 10 mg via ORAL
  Filled 2013-07-18 (×10): qty 1

## 2013-07-18 MED ORDER — VANCOMYCIN HCL 500 MG IV SOLR
500.0000 mg | Freq: Two times a day (BID) | INTRAVENOUS | Status: DC
  Administered 2013-07-19 – 2013-07-28 (×19): 500 mg via INTRAVENOUS
  Filled 2013-07-18 (×21): qty 500

## 2013-07-18 MED ORDER — DULOXETINE HCL 30 MG PO CPEP
30.0000 mg | ORAL_CAPSULE | Freq: Every day | ORAL | Status: DC
  Administered 2013-07-18 – 2013-07-28 (×11): 30 mg via ORAL
  Filled 2013-07-18 (×12): qty 1

## 2013-07-18 MED ORDER — ONDANSETRON HCL 4 MG/2ML IJ SOLN: 4.0000 mg | Freq: Four times a day (QID) | INTRAMUSCULAR | Status: DC | PRN

## 2013-07-18 MED ORDER — FUROSEMIDE 10 MG/ML IJ SOLN
20.0000 mg | Freq: Two times a day (BID) | INTRAMUSCULAR | Status: DC
  Administered 2013-07-18 – 2013-07-19 (×3): 20 mg via INTRAVENOUS
  Filled 2013-07-18 (×3): qty 2

## 2013-07-18 MED ORDER — APIXABAN 5 MG PO TABS
ORAL_TABLET | ORAL | Status: AC
  Filled 2013-07-18: qty 1

## 2013-07-18 MED ORDER — APIXABAN 5 MG PO TABS
5.0000 mg | ORAL_TABLET | Freq: Two times a day (BID) | ORAL | Status: DC
  Administered 2013-07-18 – 2013-07-28 (×22): 5 mg via ORAL
  Filled 2013-07-18 (×24): qty 1

## 2013-07-18 MED ORDER — SODIUM CHLORIDE 0.9 % IJ SOLN
3.0000 mL | Freq: Two times a day (BID) | INTRAMUSCULAR | Status: DC
  Administered 2013-07-18 – 2013-07-27 (×11): 3 mL via INTRAVENOUS

## 2013-07-18 MED ORDER — DEXTROSE 5 % IV SOLN
1.0000 g | INTRAVENOUS | Status: DC
  Administered 2013-07-18 – 2013-07-28 (×11): 1 g via INTRAVENOUS
  Filled 2013-07-18 (×11): qty 10

## 2013-07-18 MED ORDER — ONDANSETRON HCL 4 MG PO TABS: 4.0000 mg | ORAL_TABLET | Freq: Four times a day (QID) | ORAL | Status: DC | PRN

## 2013-07-18 MED ORDER — DILTIAZEM HCL 100 MG IV SOLR
5.0000 mg/h | INTRAVENOUS | Status: DC
  Administered 2013-07-18: 5 mg/h via INTRAVENOUS
  Administered 2013-07-19: 10 mg/h via INTRAVENOUS
  Administered 2013-07-19: 8 mg/h via INTRAVENOUS
  Administered 2013-07-20: 5 mg/h via INTRAVENOUS
  Filled 2013-07-18: qty 100

## 2013-07-18 MED ORDER — SODIUM CHLORIDE 0.9 % IJ SOLN: 3.0000 mL | INTRAMUSCULAR | Status: DC | PRN

## 2013-07-18 MED ORDER — METOPROLOL TARTRATE 50 MG PO TABS
50.0000 mg | ORAL_TABLET | Freq: Two times a day (BID) | ORAL | Status: DC
  Administered 2013-07-18 – 2013-07-23 (×12): 50 mg via ORAL
  Filled 2013-07-18 (×12): qty 1

## 2013-07-18 MED ORDER — AMIODARONE HCL 200 MG PO TABS
200.0000 mg | ORAL_TABLET | Freq: Every day | ORAL | Status: DC
  Administered 2013-07-18 – 2013-07-29 (×12): 200 mg via ORAL
  Filled 2013-07-18 (×13): qty 1

## 2013-07-18 MED ORDER — DEXTROSE 5 % IV SOLN
INTRAVENOUS | Status: AC
  Filled 2013-07-18: qty 10

## 2013-07-18 NOTE — Progress Notes (Signed)
ANTIBIOTIC CONSULT NOTE - INITIAL  Pharmacy Consult for vancomycin Indication: gram +cocci in chains in blood cultures  No Known Allergies  Patient Measurements: Height: 5\' 4"  (162.6 cm) Weight: 218 lb 14.7 oz (99.3 kg) IBW/kg (Calculated) : 54.7 Adjusted Body Weight: 70kg   Vital Signs: Temp: 97.8 F (36.6 C) (11/24 2000) Temp src: Axillary (11/24 2000) BP: 135/81 mmHg (11/24 2200) Pulse Rate: 120 (11/24 2219) Intake/Output from previous day: 11/23 0701 - 11/24 0700 In: 191.6 [I.V.:141.6; IV Piggyback:50] Out: 4100 [Urine:4100] Intake/Output from this shift:    Labs:  Recent Labs  07/16/13 0615 07/17/13 1755 07/18/13 0500  WBC  --  11.3* 16.0*  HGB  --  15.6* 16.5*  PLT  --  226 207  CREATININE 0.91 0.96 1.05   Estimated Creatinine Clearance: 49.7 ml/min (by C-G formula based on Cr of 1.05). No results found for this basename: VANCOTROUGH, Leodis Binet, VANCORANDOM, GENTTROUGH, GENTPEAK, GENTRANDOM, TOBRATROUGH, TOBRAPEAK, TOBRARND, AMIKACINPEAK, AMIKACINTROU, AMIKACIN,  in the last 72 hours   Microbiology: Recent Results (from the past 720 hour(s))  MRSA PCR SCREENING     Status: None   Collection Time    07/12/13  2:42 AM      Result Value Range Status   MRSA by PCR NEGATIVE  NEGATIVE Final   Comment:            The GeneXpert MRSA Assay (FDA     approved for NASAL specimens     only), is one component of a     comprehensive MRSA colonization     surveillance program. It is not     intended to diagnose MRSA     infection nor to guide or     monitor treatment for     MRSA infections.  CULTURE, BLOOD (ROUTINE X 2)     Status: None   Collection Time    07/17/13  5:54 PM      Result Value Range Status   Specimen Description BLOOD RIGHT ANTECUBITAL   Final   Special Requests BOTTLES DRAWN AEROBIC ONLY 6CC   Final   Culture     Final   Value: GRAM POSITIVE COCCI IN CHAINS     Gram Stain Report Called to,Read Back By and Verified With: SMITH J AT 2139 ON  [id number] BY FORSYTH K   Report Status PENDING   Incomplete  CULTURE, BLOOD (ROUTINE X 2)     Status: None   Collection Time    07/17/13  6:55 PM      Result Value Range Status   Specimen Description LEFT ANTECUBITAL   Final   Special Requests BOTTLES DRAWN AEROBIC AND ANAEROBIC 8CC EACH   Final   Culture NO GROWTH 1 DAY   Final   Report Status PENDING   Incomplete  MRSA PCR SCREENING     Status: Abnormal   Collection Time    07/17/13  8:04 PM      Result Value Range Status   MRSA by PCR POSITIVE (*) NEGATIVE Final   Comment:            The GeneXpert MRSA Assay (FDA     approved for NASAL specimens     only), is one component of a     comprehensive MRSA colonization     surveillance program. It is not     intended to diagnose MRSA     infection nor to guide or     monitor treatment for     MRSA infections.  RESULT CALLED TO, READ BACK BY AND VERIFIED WITH:     H. MCLEOD AT 2317 ON 07/17/13 BY Wynonia Lawman    Medical History: Past Medical History  Diagnosis Date  . Essential hypertension, benign   . Atrial fibrillation   . Bronchitis   . Depression   . Chronic combined systolic and diastolic heart failure      LVEF 45-50%  . 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 Chronic systolic heart failure 06/18/2013    Medications:  Scheduled:  . amiodarone  200 mg Oral Daily  . antiseptic oral rinse  15 mL Mouth Rinse BID  . apixaban  5 mg Oral BID  . atorvastatin  10 mg Oral q1800  . cefTRIAXone (ROCEPHIN)  IV  1 g Intravenous Q24H  . Chlorhexidine Gluconate Cloth  6 each Topical Q0600  . diltiazem  240 mg Oral Daily  . DULoxetine  30 mg Oral Daily  . furosemide  20 mg Intravenous Q12H  . metoprolol  50 mg Oral BID  . mupirocin ointment  1 application Nasal BID  . potassium chloride SA  20 mEq Oral Daily  . sodium chloride  3 mL Intravenous Q12H   Infusions:  . sodium chloride 10 mL/hr at 07/18/13 0700  . diltiazem (CARDIZEM) infusion     PRN: sodium chloride, acetaminophen,  ondansetron (ZOFRAN) IV, ondansetron, sodium chloride  Assessment: 33yr female with gram + cocci in chains in blood culture.  Receiving Rocephin 1gm IV q24h.  Vancomycin to be added.  Goal of Therapy:  Desire vancomycin trough level to be 12-67mcg/ml.  Will start vancomycin loading dose tonight and then a maintenance regimen tomorrow.  Plan:  1.  Vancomycin 1gm x 1 dose tonight 2.  Vancomycin 500mg  IV q12h 3.  Check steady state trough level as indicated 4.  Follow sx's and clinical signs for septicemia  Scarlett Presto 07/18/2013,10:35 PM

## 2013-07-18 NOTE — H&P (Signed)
PCP:   Colette Ribas, MD   Chief Complaint:  Shortness of breath HPI: 79 year old female who was brought from [facility] for shortness of breath. Patient is nonverbal due to recent CVA. Patient was discharged from [facility] after patient was found to have CVA and atrial thrombus. As per daughter patient started having shortness of breath an irregular heartbeat social sent to the ED for further evaluation. Patient is unable to provide any history. She was seen by [facility]ology at [facility] and started on anticoagulation with eliquis. And the amiodarone was restarted for heart rate control. Daughter at bedside says that she did not have nausea vomiting or diarrhea. She had fever.  Allergies:  No Known Allergies    Past Medical History  Diagnosis Date  . Essential hypertension, benign   . Atrial fibrillation   . Bronchitis   . Depression   . Chronic combined systolic and diastolic heart failure      LVEF 45-50%  . 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 Chronic systolic heart failure 06/18/2013    Past Surgical History  Procedure Laterality Date  . Shoulder surgery      right    Prior to Admission medications   Medication Sig Start Date End Date Taking? Authorizing Provider  amiodarone (PACERONE) 200 MG tablet Take 1 tablet (200 mg total) by mouth daily. 07/16/13  Yes David L Rinehuls, PA-C  apixaban (ELIQUIS) 5 MG TABS tablet Take 1 tablet (5 mg total) by mouth 2 (two) times daily. 07/16/13  Yes David L Rinehuls, PA-C  DULoxetine (CYMBALTA) 30 MG capsule Take 30 mg by mouth daily.   Yes Historical Provider, MD  furosemide (LASIX) 40 MG tablet Take 40 mg by mouth daily.    Yes Historical Provider, MD  lisinopril-hydrochlorothiazide (PRINZIDE,ZESTORETIC) 20-12.5 MG per tablet Take 1 tablet by mouth daily.   Yes Historical Provider, MD  metoprolol (LOPRESSOR) 50 MG tablet Take 1 tablet (50 mg total) by mouth 2 (two) times daily. 07/16/13  Yes David L Rinehuls, PA-C  potassium  chloride SA (K-DUR,KLOR-CON) 20 MEQ tablet Take 1 tablet (20 mEq total) by mouth daily. 07/16/13  Yes David L Rinehuls, PA-C  senna (SENOKOT) 8.6 MG tablet Take 2 tablets by mouth 2 (two) times daily.   Yes Historical Provider, MD  acetaminophen (TYLENOL) 500 MG tablet Take 1,000 mg by mouth every 6 (six) hours as needed for pain.    Historical Provider, MD  atorvastatin (LIPITOR) 10 MG tablet Take 1 tablet (10 mg total) by mouth daily at 6 PM. 07/16/13   David L Rinehuls, PA-C  Olopatadine HCl (PATADAY) 0.2 % SOLN Apply 1 drop to eye daily as needed (allergies).    Historical Provider, MD  ondansetron (ZOFRAN) 4 MG tablet Take 4 mg by mouth every 8 (eight) hours as needed for nausea or vomiting.    Historical Provider, MD    Social History:  reports that she has never smoked. She does not have any smokeless tobacco history on file. She reports that she does not drink alcohol or use illicit drugs.  Family History  Problem Relation Age of Onset  . Heart failure Mother      All the positives are listed in BOLD  Review of Systems:  Unobtainable as patient is nonverbal   Physical Exam: Blood pressure 147/70, pulse 94, temperature 98.2 F (36.8 C), temperature source Axillary, resp. rate 18, height 5\' 4"  (1.626 m), weight 99 kg (218 lb 4.1 oz), SpO2 94.00%. Constitutional:   Patient  is a mildly obese female in no acute distress Head: Normocephalic and atraumatic Mouth: Mucus membranes moist Eyes: PERRL, EOMI, conjunctivae normal Neck: Supple, No Thyromegaly Cardiovascular: RRR, S1 normal, S2 normal Pulmonary/Chest: Bibasilar crackles Abdominal: Soft. Non-tender, non-distended, bowel sounds are normal, no masses, organomegaly, or guarding present.  Neurological: A&O x3, Strenght is normal and symmetric bilaterally, cranial nerve II-XII are grossly intact, no focal motor deficit, sensory intact to light touch bilaterally.  Extremities : No Cyanosis, Clubbing or Edema   Labs on  Admission:  Results for orders placed during the hospital encounter of 07/17/13 (from the past 48 hour(s))  LACTIC ACID, PLASMA     Status: None   Collection Time    07/17/13  5:54 PM      Result Value Range   Lactic Acid, Venous 2.1  0.5 - 2.2 mmol/L  TROPONIN I     Status: None   Collection Time    07/17/13  5:55 PM      Result Value Range   Troponin I <0.30  <0.30 ng/mL   Comment:            Due to the release kinetics of cTnI,     a negative result within the first hours     of the onset of symptoms does not rule out     myocardial infarction with certainty.     If myocardial infarction is still suspected,     repeat the test at appropriate intervals.  CBC     Status: Abnormal   Collection Time    07/17/13  5:55 PM      Result Value Range   WBC 11.3 (*) 4.0 - 10.5 K/uL   RBC 5.11  3.87 - 5.11 MIL/uL   Hemoglobin 15.6 (*) 12.0 - 15.0 g/dL   HCT 16.1 (*) 09.6 - 04.5 %   MCV 96.3  78.0 - 100.0 fL   MCH 30.5  26.0 - 34.0 pg   MCHC 31.7  30.0 - 36.0 g/dL   RDW 40.9  81.1 - 91.4 %   Platelets 226  150 - 400 K/uL  BASIC METABOLIC PANEL     Status: Abnormal   Collection Time    07/17/13  5:55 PM      Result Value Range   Sodium 147 (*) 135 - 145 mEq/L   Potassium 4.0  3.5 - 5.1 mEq/L   Chloride 111  96 - 112 mEq/L   CO2 25  19 - 32 mEq/L   Glucose, Bld 155 (*) 70 - 99 mg/dL   BUN 31 (*) 6 - 23 mg/dL   Creatinine, Ser 7.82  0.50 - 1.10 mg/dL   Calcium 95.6  8.4 - 21.3 mg/dL   GFR calc non Af Amer 55 (*) >90 mL/min   GFR calc Af Amer 64 (*) >90 mL/min   Comment: (NOTE)     The eGFR has been calculated using the CKD EPI equation.     This calculation has not been validated in all clinical situations.     eGFR's persistently <90 mL/min signify possible Chronic Kidney     Disease.  URINALYSIS, ROUTINE W REFLEX MICROSCOPIC     Status: Abnormal   Collection Time    07/17/13  6:52 PM      Result Value Range   Color, Urine YELLOW  YELLOW   APPearance CLEAR  CLEAR    Specific Gravity, Urine 1.025  1.005 - 1.030   pH 5.5  5.0 - 8.0  Glucose, UA NEGATIVE  NEGATIVE mg/dL   Hgb urine dipstick LARGE (*) NEGATIVE   Bilirubin Urine MODERATE (*) NEGATIVE   Ketones, ur NEGATIVE  NEGATIVE mg/dL   Protein, ur 30 (*) NEGATIVE mg/dL   Urobilinogen, UA 1.0  0.0 - 1.0 mg/dL   Nitrite NEGATIVE  NEGATIVE   Leukocytes, UA TRACE (*) NEGATIVE  URINE MICROSCOPIC-ADD ON     Status: Abnormal   Collection Time    07/17/13  6:52 PM      Result Value Range   Squamous Epithelial / LPF FEW (*) RARE   WBC, UA 0-2  <3 WBC/hpf   RBC / HPF 21-50  <3 RBC/hpf   Bacteria, UA FEW (*) RARE  MRSA PCR SCREENING     Status: Abnormal   Collection Time    07/17/13  8:04 PM      Result Value Range   MRSA by PCR POSITIVE (*) NEGATIVE   Comment:            The GeneXpert MRSA Assay (FDA     approved for NASAL specimens     only), is one component of a     comprehensive MRSA colonization     surveillance program. It is not     intended to diagnose MRSA     infection nor to guide or     monitor treatment for     MRSA infections.     RESULT CALLED TO, READ BACK BY AND VERIFIED WITH:     H. MCLEOD AT 2317 ON 07/17/13 BY Wynonia Lawman    Radiological Exams on Admission: Dg Chest Portable 1 View  07/17/2013   CLINICAL DATA:  Atrial fibrillation, tachypnea, shortness of breath  EXAM: PORTABLE CHEST - 1 VIEW  COMPARISON:  07/12/2013  FINDINGS: Moderate cardiac enlargement. Aortic arch calcification. Mild vascular congestion and interstitial prominence. No consolidation or effusion.  IMPRESSION: Cardiomegaly with vascular congestion.  Minimal interstitial edema.   Electronically Signed   By: Esperanza Heir M.D.   On: 07/17/2013 18:00    Assessment/Plan Principal Problem:   Acute on chronic diastolic CHF (congestive heart failure), NYHA class 2 Active Problems:   Atrial fibrillation   Atrial thrombus   CVA (cerebral infarction)  79 year old female with a history of chronic diastolic  CHF grade 2 dysfunction, as per echo done on 11/17 now been admitted with acute on chronic diastolic CHF and atrial fibrillation with RVR. Patient has been started on Cardizem drip which will be continued also start back on metoprolol and amiodarone at the home dose. Patient was given one dose of Lasix in the ED will continue with Lasix 20 g IV every 12 hours. Also has a history of fever and patient does have abnormal UA, urine culture is pending. We'll start IV Rocephin 1 g every 24 hours.  Code status: Patient is full code  Family discussion: Discussed with patient's daughter at bedside   Time Spent on Admission: 60 min  [facility] Pager: [phone] 07/18/2013, 12:16 AM  If 7PM-7AM, please contact night-coverage  [email or web address]  Password [email or web address]

## 2013-07-18 NOTE — Progress Notes (Signed)
UR chart review completed.  

## 2013-07-18 NOTE — Care Management Note (Addendum)
    Page 1 of 1   07/29/2013     12:02:25 PM   CARE MANAGEMENT NOTE 07/29/2013  Patient:  Laurie Ramirez, Laurie Ramirez   Account Number:  [id number]  Date Initiated:  07/18/2013  Documentation initiated by:  Sharrie Rothman  Subjective/Objective Assessment:   Pt admitted from [facility] with A fib and CHF. Pt will return to facility at discharge.     Action/Plan:   CSw to arrange discharge to facility when medically stable.   Anticipated DC Date:  07/25/2013   Anticipated DC Plan:  SKILLED NURSING FACILITY  In-house referral  Clinical Social Worker      DC Planning Services  CM consult      Choice offered to / List presented to:             Status of service:  Completed, signed off Medicare Important Message given?   (If response is "NO", the following Medicare IM given date fields will be blank) Date Medicare IM given:   Date Additional Medicare IM given:    Discharge Disposition:  HOME W HOME HEALTH SERVICES  Per UR Regulation:    If discussed at Long Length of Stay Meetings, dates discussed:   07/26/2013  07/28/2013    Comments:  07/29/13 Rosemary Holms RN BSN CM Pt going home with [employer] Per Shon Hale, Beth has confirmed that equipment has been set up in the home and CSW [place] confirms EMS has been called to transport.  07/26/13 1440 Arlyss Queen, RN BSN CM Pts son stating now that he will be taking pt home at discharge. Will attempt to contact son in regards to [facility] and DME needs.  07/18/13 1115 Arlyss Queen, RN BSN CM

## 2013-07-18 NOTE — Progress Notes (Addendum)
[facility] PROGRESS NOTE  Laurie Ramirez EXB:[id number] DOB: [birth year] DOA: 07/17/2013 PCP: Colette Ribas, MD  Assessment/Plan: 1. Atrial fibrillation with rapid ventricular response. May have been precipitated by acute decompensated CHF. Will continue Cardizem drip. She is also on Amiodarone 200mg  daily and Metoprolol 50mg  BID.  2. Acute on chronic combined systolic and diastolic congestive heart failure. She transthoracic echocardiogram performed on 07/11/2013 which showed an ejection fraction of 45-50%, diffuse hypokinesis with grade 2 diastolic dysfunction. Diuresis well overnight with IV Lasix. Will continue IV Lasix at 20 mg IV twice a day. 3. Leukocytosis. White count increasing could be secondary to UTI, on Rocephin.  4. Hypertension. Blood pressure stable, we'll continue IV Cardizem, metoprolol 50 mg twice a day. 5. Dyslipidemia. Continue statin therapy.  6. History of CVA. Recent discharge from [facility], CT scan performed on 07/12/2013 showing large region of hypodensity in the left MCA distribution compatible with MCA infarct. Continue statin and Eliquis.   Code Status: Full Code Disposition Plan: Cardiology Consult, continue close monitoring in ICU.    Consultants:  Cardiology    HPI/Subjective: Patient is a 79 year old female with a past medical history of CVA, admitted overnight presented with complaints of shortness of breath. In the emergency department she was found to be in A. fib with RVR. She also had signs and symptoms consistent with acute decompensated congestive heart failure. She was admitted to the ICU overnight, started on a Cardizem drip. This morning she has heart rates in the 100s to 110's with Cardizem running at 15. She had a total output of 4,100.   Objective: Filed Vitals:   07/18/13 0743  BP:   Pulse:   Temp: 98.5 F (36.9 C)  Resp:     Intake/Output Summary (Last 24 hours) at 07/18/13 0800 Last data filed at 07/18/13 0441  Gross per 24 hour  Intake 166.58 ml  Output   4100 ml  Net -3933.42 ml   Filed Weights   07/17/13 2100 07/18/13 0441  Weight: 99 kg (218 lb 4.1 oz) 99.3 kg (218 lb 14.7 oz)    Exam:   General:  patient is in no acute distress, nonverbal for me this morning.   Cardiovascular: Irregular rate rhythm, tachycardic, normal S1-S2, trace edema to lower extremities bilaterally   Respiratory: has scattered expiratory wheezes, bibasilar crackles, normal respiratory effort  Abdomen: obese soft nontender nondistended   Musculoskeletal:  Trace edema to lower extremities   Data Reviewed: Basic Metabolic Panel:  Recent Labs Lab 07/14/13 0330 07/15/13 0329 07/16/13 0615 07/17/13 1755 07/18/13 0500  NA 145 148* 150* 147* 153*  K 2.9* 3.1* 3.6 4.0 3.5  CL 105 109 112 111 111  CO2 26 29 28 25  32  GLUCOSE 174* 138* 156* 155* 171*  BUN 20 18 22  31* 31*  CREATININE 0.97 0.82 0.91 0.96 1.05  CALCIUM 9.1 9.3 9.2 10.0 9.9  MG 1.2* 2.1  --   --   --    Liver Function Tests:  Recent Labs Lab 07/18/13 0500  AST 49*  ALT 19  ALKPHOS 151*  BILITOT 1.5*  PROT 7.0  ALBUMIN 2.3*   No results found for this basename: LIPASE, AMYLASE,  in the last 168 hours No results found for this basename: AMMONIA,  in the last 168 hours CBC:  Recent Labs Lab 07/17/13 1755 07/18/13 0500  WBC 11.3* 16.0*  HGB 15.6* 16.5*  HCT 49.2* 51.8*  MCV 96.3 97.6  PLT 226 207   Cardiac Enzymes:  Recent Labs Lab 07/17/13 1755  TROPONINI <0.30   BNP (last 3 results)  Recent Labs  06/18/13 1015 06/27/13 1618  PROBNP 11470.0* 2520.0*   CBG:  Recent Labs Lab 07/12/13 0009  GLUCAP 164*    Recent Results (from the past 240 hour(s))  MRSA PCR SCREENING     Status: None   Collection Time    07/12/13  2:42 AM      Result Value Range Status   MRSA by PCR NEGATIVE  NEGATIVE Final   Comment:            The GeneXpert MRSA Assay (FDA     approved for NASAL specimens     only), is one  component of a     comprehensive MRSA colonization     surveillance program. It is not     intended to diagnose MRSA     infection nor to guide or     monitor treatment for     MRSA infections.  MRSA PCR SCREENING     Status: Abnormal   Collection Time    07/17/13  8:04 PM      Result Value Range Status   MRSA by PCR POSITIVE (*) NEGATIVE Final   Comment:            The GeneXpert MRSA Assay (FDA     approved for NASAL specimens     only), is one component of a     comprehensive MRSA colonization     surveillance program. It is not     intended to diagnose MRSA     infection nor to guide or     monitor treatment for     MRSA infections.     RESULT CALLED TO, READ BACK BY AND VERIFIED WITH:     H. MCLEOD AT 2317 ON 07/17/13 BY Wynonia Lawman     Studies: Dg Chest Portable 1 View  07/17/2013   CLINICAL DATA:  Atrial fibrillation, tachypnea, shortness of breath  EXAM: PORTABLE CHEST - 1 VIEW  COMPARISON:  07/12/2013  FINDINGS: Moderate cardiac enlargement. Aortic arch calcification. Mild vascular congestion and interstitial prominence. No consolidation or effusion.  IMPRESSION: Cardiomegaly with vascular congestion.  Minimal interstitial edema.   Electronically Signed   By: Esperanza Heir M.D.   On: 07/17/2013 18:00    Scheduled Meds: . amiodarone  200 mg Oral Daily  . antiseptic oral rinse  15 mL Mouth Rinse BID  . apixaban  5 mg Oral BID  . atorvastatin  10 mg Oral q1800  . cefTRIAXone (ROCEPHIN)  IV  1 g Intravenous Q24H  . Chlorhexidine Gluconate Cloth  6 each Topical Q0600  . DULoxetine  30 mg Oral Daily  . furosemide  20 mg Intravenous Q12H  . metoprolol  50 mg Oral BID  . mupirocin ointment  1 application Nasal BID  . potassium chloride SA  20 mEq Oral Daily  . sodium chloride  3 mL Intravenous Q12H   Continuous Infusions: . diltiazem (CARDIZEM) infusion 15 mg/hr (07/18/13 0454)    Principal Problem:   Acute on chronic diastolic CHF (congestive heart failure),  NYHA class 2 Active Problems:   Atrial fibrillation   Atrial thrombus   CVA (cerebral infarction)    Time spent: 35 minutes    Jeralyn Bennett  [facility] Pager [phone]. If 7PM-7AM, please contact night-coverage at [email or web address], password [facility] 07/18/2013, 8:00 AM  LOS: 1 day

## 2013-07-18 NOTE — Progress Notes (Signed)
Lab called with critical of gram positive cocci in chains growing in an aerobic bottle. MD paged. Orders given to add vancomycin per pharmacy consult.

## 2013-07-18 NOTE — Progress Notes (Signed)
Laurie Ramirez is a 79 year old recently hospitalized for a stroke that was found on a CT scan performed on 07/12/13. Since this event she reportedly has required assistance with all activities of daily living. I evaluated her for the first time this morning, found her to be confused, disoriented and not replying appropriately to my questions. She was not able to tell me why she was here or even where she was. This likely represents vascular dementia. Based on my findings I do not believe she has the capacity to make her own medical decisions.    Sincerely,     Mahala Menghini MD

## 2013-07-18 NOTE — Clinical Social Work Psychosocial (Signed)
    Clinical Social Work Department BRIEF PSYCHOSOCIAL ASSESSMENT 07/18/2013  Patient:  Laurie Ramirez, Laurie Ramirez     Account Number:  [id number]     Admit date:  07/17/2013  Clinical Social Worker:  Santa Genera, CLINICAL SOCIAL WORKER  Date/Time:  07/18/2013 11:00 AM  Referred by:  CSW  Date Referred:  07/18/2013 Referred for  SNF Placement   Other Referral:   Interview type:  Family Other interview type:    PSYCHOSOCIAL DATA Living Status:  FACILITY Admitted from facility:  [facility] Level of care:  Skilled Nursing Facility Primary support name:  Melody Alona Bene Primary support relationship to patient:  CHILD, ADULT Degree of support available:   Family very supportive, wants change of SNF.    CURRENT CONCERNS Current Concerns  Post-Acute Placement   Other Concerns:    SOCIAL WORK ASSESSMENT / PLAN CSW attempted to meet w patient at bedside, patient is post stroke, expressive and receptive aphasia.  Daughters and son in law at bedside were able to provide information. Per daughter, mother was driving and fully independent at the end of October 2014.  Had a fall which began the current episode of illness.  Was hospitalized at [facility] for a stroke and blood clot, then discharged to [facility].  Was only at SNF for 12 hours when family noted that patient was having difficulty breathing and requested that EMS transport to [facility].  Family states that they are having a hard time understanding what is happening medically w patient, encouraged to discuss patient's clinical situation w MD and RN.    At this point, it is unclear what family wants for discharge planning.  At present, they have requested that CSW not contact [facility] to update and they do not want to return to this SNF at discharge.  Family wants "somewhere that can give her what she needs, like oxygen."  CSW explained what SNFs can provide, encouraged family to defer decisions about discharge until patient's  needs become clearer.  Family asked  for information about power of attorney - patient is unable to handle her affairs at present.  Given information about guardianship application process at clerk of court and encouraged to pursue.   Assessment/plan status:  Psychosocial Support/Ongoing Assessment of Needs Other assessment/ plan:   Information/referral to community resources:   [facility]    PATIENT'S/FAMILY'S RESPONSE TO PLAN OF CARE: Family appreciative of support, are dealing w significant change in patient's medical condition in short period of time.  Need support to make appropriate plan for discharge planning.        Santa Genera, LCSW Clinical Social Worker [phone])

## 2013-07-18 NOTE — Progress Notes (Signed)
Present with patient's family for emotional and spiritual support. Will continue.

## 2013-07-19 ENCOUNTER — Inpatient Hospital Stay (HOSPITAL_COMMUNITY): Payer: Medicare Other

## 2013-07-19 DIAGNOSIS — E876 Hypokalemia: Secondary | ICD-10-CM

## 2013-07-19 DIAGNOSIS — I509 Heart failure, unspecified: Secondary | ICD-10-CM

## 2013-07-19 DIAGNOSIS — N179 Acute kidney failure, unspecified: Secondary | ICD-10-CM

## 2013-07-19 DIAGNOSIS — D72829 Elevated white blood cell count, unspecified: Secondary | ICD-10-CM

## 2013-07-19 DIAGNOSIS — I5033 Acute on chronic diastolic (congestive) heart failure: Secondary | ICD-10-CM

## 2013-07-19 LAB — URINE CULTURE: Colony Count: 30000

## 2013-07-19 LAB — BASIC METABOLIC PANEL
BUN: 29 mg/dL — ABNORMAL HIGH (ref 6–23)
Chloride: 110 mEq/L (ref 96–112)
Creatinine, Ser: 1.01 mg/dL (ref 0.50–1.10)
GFR calc Af Amer: 60 mL/min — ABNORMAL LOW (ref >90)
GFR calc non Af Amer: 52 mL/min — ABNORMAL LOW (ref >90)
Glucose, Bld: 152 mg/dL — ABNORMAL HIGH (ref 70–99)
Potassium: 2.8 mEq/L — ABNORMAL LOW (ref 3.5–5.1)

## 2013-07-19 LAB — CBC
HCT: 52.9 % — ABNORMAL HIGH (ref 36.0–46.0)
Hemoglobin: 16.8 g/dL — ABNORMAL HIGH (ref 12.0–15.0)
MCH: 30.7 pg (ref 26.0–34.0)
MCHC: 31.8 g/dL (ref 30.0–36.0)
Platelets: 226 10*3/uL (ref 150–400)
RBC: 5.47 MIL/uL — ABNORMAL HIGH (ref 3.87–5.11)

## 2013-07-19 LAB — LACTIC ACID, PLASMA: Lactic Acid, Venous: 3.3 mmol/L — ABNORMAL HIGH (ref 0.5–2.2)

## 2013-07-19 MED ORDER — FUROSEMIDE 40 MG PO TABS
40.0000 mg | ORAL_TABLET | Freq: Two times a day (BID) | ORAL | Status: DC
  Administered 2013-07-19 – 2013-07-21 (×6): 40 mg via ORAL
  Filled 2013-07-19 (×6): qty 1

## 2013-07-19 MED ORDER — FUROSEMIDE 20 MG PO TABS: 20.0000 mg | ORAL_TABLET | Freq: Two times a day (BID) | ORAL | Status: DC

## 2013-07-19 MED ORDER — POTASSIUM CHLORIDE CRYS ER 20 MEQ PO TBCR
40.0000 meq | EXTENDED_RELEASE_TABLET | Freq: Four times a day (QID) | ORAL | Status: AC
  Administered 2013-07-19 (×3): 40 meq via ORAL
  Filled 2013-07-19 (×3): qty 2

## 2013-07-19 NOTE — Progress Notes (Signed)
Pt heart rate increased to sustain in the 120s and would go as high as 170 in bursts. MD notifed. Orders given to resume cardizem drip.

## 2013-07-19 NOTE — Progress Notes (Signed)
INITIAL NUTRITION ASSESSMENT  DOCUMENTATION CODES Per approved criteria  -Obesity Unspecified   INTERVENTION: Magic cup q HS,  supplement provides 290 kcal and 9 grams of protein. RD will follow for nutrition care  NUTRITION DIAGNOSIS: Inadequate oral intake related to swallow difficulty as evidenced by pureed diet with thickened liquids following recent stroke per SLP evaluation/recommendation 07/14/13.   Goal: Pt to meet >/= 90% of their estimated nutrition needs   Monitor:  Skin assessments, po intake, labs and wt trends  Reason for Assessment: Low Braden= 34  79 y.o. female  Admitting Dx: Acute on chronic diastolic CHF (congestive heart failure), NYHA class 2  ASSESSMENT: Pt from [facility] where she was placed after acute stroke. She is total care. RD pulled to pt due to Low Braden Score. She is high risk for skin breakdown. Good meal intake today per nutrition services staff. Daughter is assisting with feeding. Pt is at risk for malnutrition given her functional decline and multiple chronic diseases.   Patient Active Problem List   Diagnosis Date Noted  . Acute on chronic diastolic CHF (congestive heart failure), NYHA class 2 07/18/2013  . CVA (cerebral infarction) 07/12/2013  . 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 Essential hypertension, benign 07/08/2013  . Acute on chronic renal failure 07/08/2013  . Acute renal failure 07/08/2013  . Nausea and vomiting 07/08/2013  . Pancreatitis, acute 07/08/2013  . Hypotension, unspecified 07/08/2013  . Chronic systolic heart failure 06/18/2013  . Atrial fibrillation 06/18/2013    Height: Ht Readings from Last 1 Encounters:  07/17/13 5\' 4"  (1.626 m)    Weight: Wt Readings from Last 1 Encounters:  07/18/13 218 lb 14.7 oz (99.3 kg)    Ideal Body Weight: 120# (54.5 kg)  % Ideal Body Weight: 183%  Wt Readings from Last 10 Encounters:  07/18/13 218 lb 14.7 oz (99.3 kg)  07/12/13 215 lb 2.7 oz (97.6 kg)  07/08/13 206 lb 1.3 oz  (93.477 kg)  06/27/13 204 lb (92.534 kg)  06/21/13 218 lb 11.1 oz (99.2 kg)  05/17/13 190 lb (86.183 kg)    Usual Body Weight: 205-215#  % Usual Body Weight: 107%  BMI:  Body mass index is 37.56 kg/(m^2).likely skewed due to fluid status  Estimated Nutritional Needs: Kcal: 1300-1550 Protein: 80-90 gr Fluid: per team goals  Skin: abrasion to knee and bilateral toe  Diet Order: Dysphagia 1 with Honey-thick liquids  EDUCATION NEEDS: -No education needs identified at this time   Intake/Output Summary (Last 24 hours) at 07/19/13 1342 Last data filed at 07/19/13 0500  Gross per 24 hour  Intake    427 ml  Output   1600 ml  Net  -1173 ml    Last BM: 07/10/13  Labs:   Recent Labs Lab 07/14/13 0330 07/15/13 0329  07/17/13 1755 07/18/13 0500 07/19/13 0505  NA 145 148*  < > 147* 153* 152*  K 2.9* 3.1*  < > 4.0 3.5 2.8*  CL 105 109  < > 111 111 110  CO2 26 29  < > 25 32 31  BUN 20 18  < > 31* 31* 29*  CREATININE 0.97 0.82  < > 0.96 1.05 1.01  CALCIUM 9.1 9.3  < > 10.0 9.9 9.7  MG 1.2* 2.1  --   --   --   --   GLUCOSE 174* 138*  < > 155* 171* 152*  < > = values in this interval not displayed.  CBG (last 3)  No results found for this basename:  GLUCAP,  in the last 72 hours  Scheduled Meds: . amiodarone  200 mg Oral Daily  . antiseptic oral rinse  15 mL Mouth Rinse BID  . apixaban  5 mg Oral BID  . atorvastatin  10 mg Oral q1800  . cefTRIAXone (ROCEPHIN)  IV  1 g Intravenous Q24H  . Chlorhexidine Gluconate Cloth  6 each Topical Q0600  . diltiazem  240 mg Oral Daily  . DULoxetine  30 mg Oral Daily  . furosemide  40 mg Oral BID  . metoprolol  50 mg Oral BID  . mupirocin ointment  1 application Nasal BID  . potassium chloride  40 mEq Oral Q6H  . sodium chloride  3 mL Intravenous Q12H  . vancomycin  500 mg Intravenous Q12H    Continuous Infusions: . sodium chloride 10 mL/hr at 07/18/13 0700  . diltiazem (CARDIZEM) infusion 10 mg/hr (07/19/13 1013)    Past  Medical History  Diagnosis Date  . Essential hypertension, benign   . Atrial fibrillation   . Bronchitis   . Depression   . Chronic combined systolic and diastolic heart failure      LVEF 45-50%  . 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 Chronic systolic heart failure 06/18/2013    Past Surgical History  Procedure Laterality Date  . Shoulder surgery      right    Royann Shivers MS,RD,CSG,LDN Office: #[phone] Pager: [phone]

## 2013-07-19 NOTE — Clinical Social Work Note (Signed)
CSW met with pt's daughter Melody who reports she was granted interim guardianship yesterday afternoon. She states hearing is now set for December. Paperwork at bedside.  Derenda Fennel, [place] [phone]

## 2013-07-19 NOTE — Progress Notes (Addendum)
[facility] PROGRESS NOTE  Laurie Ramirez ZOX:[id number] DOB: 1931/12/09 DOA: 07/17/2013 PCP: Colette Ribas, MD  Interim Summary Patient is a pleasant 79 year old female with a past medical history of chronic atrial for ablation recently admitted to  on 07/08/2013 where a CT scan of head without contrast performed on 07/12/2013 showed large region of hypodensity in the left MCA distribution compatible with MCA infarct. She was started on Eliquis during that hospitalization and discharge to skilled nursing facility for rehabilitation. On 07/18/2013 she was transferred from [facility] skilled nursing facility to the emergency department as she was noted by nursing staff to be increasingly dyspneic. Initial chest x-ray showed moderate cardiac enlargement with mild vascular congestion and interstitial prominence. No consolidation was noted. She was also found to be in atrial fibrillation with rapid ventricular response, having ventricular rates in the 130s to 140s. She was started on a Cardizem drip and admitted to the intensive care unit. On the following day, heart rates were better controlled for which her Cardizem drip was discontinued and transition to oral Cardizem at 240 mg by mouth daily. Her amiodarone was continued at 200 mg by mouth daily and metoprolol was continued at 50 mg by mouth twice a day. I suspect that Afib with RVR may have precipitated by acute decompensated congestive heart failure. Based on transthoracic echocardiogram on 07/11/2013 she has an ejection fraction of 45-50% with grade 2 diastolic dysfunction. Other issues be addressed; patient having 1 out of 2 positive blood cultures for gram-positive cocci in chains for which she has been on vancomycin. Organism identification and susceptibility testing is in progress. On 07/19/2013 I repeated a set of blood cultures. She is also being covered with ceftriaxone given question of urinary tract infection. Her white count  has continued to trend up to 18,300 on 07/19/2013. She has remained afebrile. Lactate was 2.1 on 07/17/2013, which I plan to repeat today given an upward trend in white count. Also plan to repeat chest x-ray.   Assessment/Plan: 1. Atrial fibrillation with rapid ventricular response. May have been precipitated by acute decompensated CHF. Initially placed on a Cardizem drip, transition to oral Cardizem at 240 mg by mouth daily. Will continue amiodarone 200 mg daily and metoprolol 50 mg twice a day. This morning heart rates mostly in the 90s. 2. Acute on chronic combined systolic and diastolic congestive heart failure. She transthoracic echocardiogram performed on 07/11/2013 which showed an ejection fraction of 45-50%, diffuse hypokinesis with grade 2 diastolic dysfunction. Diuresis well overnight with IV Lasix. She was transitioned to oral Lasix at 40 mg by mouth twice a day. She had a total output of 2575 ML in the last 24 hours, now having net negative balance of 5.9 L since admission. 3. Leukocytosis. Patient's white count continues to trend up to 18,000 range today. Unclear source of infection, one out of two blood cultures growing gram-positive cocci in chains for which I have ordered repeat blood cultures today. Organism identification and susceptibility testing in progress. Will continue patient on empiric IV antibiotic therapy with IV vancomycin and ceftriaxone.  4. Hypertension. Blood pressure stable, continue Cardizem 240 mg by mouth daily and metoprolol 50 mg by mouth twice a day. 5. Dyslipidemia. Continue statin therapy.  6. Hypokalemia. Likely secondary to diuretic therapy. Will administer KCl PO times 3 doses.  7. History of CVA. Recent discharge from [facility], CT scan performed on 07/12/2013 showing large region of hypodensity in the left MCA distribution compatible with MCA infarct. Likely  secondary to thromboembolic phenomenon from atrial fibrillation. Continue statin and Eliquis.    Code Status: Full Code Disposition Plan: Patient now off Cardizem drip, will transfer to telemetry.   Consultants:  Cardiology    HPI/Subjective: Patient is a 79 year old female with a past medical history of CVA, admitted overnight presented with complaints of shortness of breath. In the emergency department she was found to be in A. fib with RVR. She also had signs and symptoms consistent with acute decompensated congestive heart failure.   Family members present at bedside, reports patient functioning at her baseline. She is nonverbal, requires assistance with all activities of daily living.  Objective: Filed Vitals:   07/19/13 0800  BP: 138/93  Pulse:   Temp:   Resp: 23    Intake/Output Summary (Last 24 hours) at 07/19/13 1203 Last data filed at 07/19/13 0500  Gross per 24 hour  Intake    477 ml  Output   1925 ml  Net  -1448 ml   Filed Weights   07/17/13 2100 07/18/13 0441  Weight: 99 kg (218 lb 4.1 oz) 99.3 kg (218 lb 14.7 oz)    Exam:   General:  patient is in no acute distress, nonverbal for me this morning.   Cardiovascular: Irregular rate rhythm, tachycardic, normal S1-S2, trace edema to lower extremities bilaterally   Respiratory: has scattered expiratory wheezes, bibasilar crackles, normal respiratory effort  Abdomen: obese soft nontender nondistended   Musculoskeletal:  Trace edema to lower extremities   Data Reviewed: Basic Metabolic Panel:  Recent Labs Lab 07/14/13 0330 07/15/13 0329 07/16/13 0615 07/17/13 1755 07/18/13 0500 07/19/13 0505  NA 145 148* 150* 147* 153* 152*  K 2.9* 3.1* 3.6 4.0 3.5 2.8*  CL 105 109 112 111 111 110  CO2 26 29 28 25  32 31  GLUCOSE 174* 138* 156* 155* 171* 152*  BUN 20 18 22  31* 31* 29*  CREATININE 0.97 0.82 0.91 0.96 1.05 1.01  CALCIUM 9.1 9.3 9.2 10.0 9.9 9.7  MG 1.2* 2.1  --   --   --   --    Liver Function Tests:  Recent Labs Lab 07/18/13 0500  AST 49*  ALT 19  ALKPHOS 151*  BILITOT 1.5*   PROT 7.0  ALBUMIN 2.3*   No results found for this basename: LIPASE, AMYLASE,  in the last 168 hours No results found for this basename: AMMONIA,  in the last 168 hours CBC:  Recent Labs Lab 07/17/13 1755 07/18/13 0500 07/19/13 0505  WBC 11.3* 16.0* 18.3*  HGB 15.6* 16.5* 16.8*  HCT 49.2* 51.8* 52.9*  MCV 96.3 97.6 96.7  PLT 226 207 226   Cardiac Enzymes:  Recent Labs Lab 07/17/13 1755  TROPONINI <0.30   BNP (last 3 results)  Recent Labs  06/18/13 1015 06/27/13 1618  PROBNP 11470.0* 2520.0*   CBG: No results found for this basename: GLUCAP,  in the last 168 hours  Recent Results (from the past 240 hour(s))  MRSA PCR SCREENING     Status: None   Collection Time    07/12/13  2:42 AM      Result Value Range Status   MRSA by PCR NEGATIVE  NEGATIVE Final   Comment:            The GeneXpert MRSA Assay (FDA     approved for NASAL specimens     only), is one component of a     comprehensive MRSA colonization     surveillance program. It is not  intended to diagnose MRSA     infection nor to guide or     monitor treatment for     MRSA infections.  CULTURE, BLOOD (ROUTINE X 2)     Status: None   Collection Time    07/17/13  5:54 PM      Result Value Range Status   Specimen Description BLOOD RIGHT ANTECUBITAL   Final   Special Requests BOTTLES DRAWN AEROBIC ONLY 6CC   Final   Culture     Final   Value: GRAM POSITIVE COCCI IN CHAINS     Gram Stain Report Called to,Read Back By and Verified With: Pasty Arch AT 2139 ON [id number] BY FORSYTH K     Performed at [facility]   Report Status PENDING   Incomplete  URINE CULTURE     Status: None   Collection Time    07/17/13  6:52 PM      Result Value Range Status   Specimen Description URINE, CATHETERIZED   Final   Special Requests NONE   Final   Culture  Setup Time     Final   Value: 07/17/2013 20:30     Performed at [facility] Count     Final   Value: 30,000 COLONIES/ML     Performed at  [facility]   Culture     Final   Value: LACTOBACILLUS SPECIES     Note: Standardized susceptibility testing for this organism is not available.     Performed at [facility]   Report Status 07/19/2013 FINAL   Final  CULTURE, BLOOD (ROUTINE X 2)     Status: None   Collection Time    07/17/13  6:55 PM      Result Value Range Status   Specimen Description BLOOD LEFT ANTECUBITAL DRAWN BY RN   Final   Special Requests BOTTLES DRAWN AEROBIC AND ANAEROBIC 8CC EACH   Final   Culture NO GROWTH 2 DAYS   Final   Report Status PENDING   Incomplete  MRSA PCR SCREENING     Status: Abnormal   Collection Time    07/17/13  8:04 PM      Result Value Range Status   MRSA by PCR POSITIVE (*) NEGATIVE Final   Comment:            The GeneXpert MRSA Assay (FDA     approved for NASAL specimens     only), is one component of a     comprehensive MRSA colonization     surveillance program. It is not     intended to diagnose MRSA     infection nor to guide or     monitor treatment for     MRSA infections.     RESULT CALLED TO, READ BACK BY AND VERIFIED WITH:     H. MCLEOD AT 2317 ON 07/17/13 BY S. VANHOORNE  CULTURE, BLOOD (ROUTINE X 2)     Status: None   Collection Time    07/19/13  9:29 AM      Result Value Range Status   Specimen Description Blood   Final   Special Requests NONE   Final   Culture NO GROWTH <24 HRS   Final   Report Status PENDING   Incomplete  CULTURE, BLOOD (ROUTINE X 2)     Status: None   Collection Time    07/19/13  9:29 AM      Result Value Range Status  Specimen Description Blood   Final   Special Requests NONE   Final   Culture NO GROWTH <24 HRS   Final   Report Status PENDING   Incomplete     Studies: Dg Chest Portable 1 View  07/17/2013   CLINICAL DATA:  Atrial fibrillation, tachypnea, shortness of breath  EXAM: PORTABLE CHEST - 1 VIEW  COMPARISON:  07/12/2013  FINDINGS: Moderate cardiac enlargement. Aortic arch calcification. Mild vascular  congestion and interstitial prominence. No consolidation or effusion.  IMPRESSION: Cardiomegaly with vascular congestion.  Minimal interstitial edema.   Electronically Signed   By: Esperanza Heir M.D.   On: 07/17/2013 18:00    Scheduled Meds: . amiodarone  200 mg Oral Daily  . antiseptic oral rinse  15 mL Mouth Rinse BID  . apixaban  5 mg Oral BID  . atorvastatin  10 mg Oral q1800  . cefTRIAXone (ROCEPHIN)  IV  1 g Intravenous Q24H  . Chlorhexidine Gluconate Cloth  6 each Topical Q0600  . diltiazem  240 mg Oral Daily  . DULoxetine  30 mg Oral Daily  . furosemide  40 mg Oral BID  . metoprolol  50 mg Oral BID  . mupirocin ointment  1 application Nasal BID  . potassium chloride  40 mEq Oral Q6H  . sodium chloride  3 mL Intravenous Q12H  . vancomycin  500 mg Intravenous Q12H   Continuous Infusions: . sodium chloride 10 mL/hr at 07/18/13 0700  . diltiazem (CARDIZEM) infusion 10 mg/hr (07/19/13 1013)    Principal Problem:   Acute on chronic diastolic CHF (congestive heart failure), NYHA class 2 Active Problems:   Atrial fibrillation   Atrial thrombus   CVA (cerebral infarction)    Time spent: 35 minutes    Jeralyn Bennett  [facility] Pager [phone]. If 7PM-7AM, please contact night-coverage at [email or web address], password [facility] 07/19/2013, 12:03 PM  LOS: 2 days

## 2013-07-20 LAB — BASIC METABOLIC PANEL
CO2: 33 mEq/L — ABNORMAL HIGH (ref 19–32)
Calcium: 9.5 mg/dL (ref 8.4–10.5)
Chloride: 111 mEq/L (ref 96–112)
GFR calc Af Amer: 65 mL/min — ABNORMAL LOW (ref >90)
Glucose, Bld: 144 mg/dL — ABNORMAL HIGH (ref 70–99)
Potassium: 3.5 mEq/L (ref 3.5–5.1)
Sodium: 154 mEq/L — ABNORMAL HIGH (ref 135–145)

## 2013-07-20 LAB — CBC
Hemoglobin: 16.4 g/dL — ABNORMAL HIGH (ref 12.0–15.0)
Platelets: 247 10*3/uL (ref 150–400)
RBC: 5.44 MIL/uL — ABNORMAL HIGH (ref 3.87–5.11)
WBC: 17.7 10*3/uL — ABNORMAL HIGH (ref 4.0–10.5)

## 2013-07-20 NOTE — Clinical Social Work Placement (Signed)
    Clinical Social Work Department CLINICAL SOCIAL WORK PLACEMENT NOTE 07/29/2013  Patient:  Laurie Ramirez, Laurie Ramirez  Account Number:  [id number] Admit date:  07/17/2013  Clinical Social Worker:  Santa Genera, CLINICAL SOCIAL WORKER  Date/time:  07/20/2013 10:30 AM  Clinical Social Work is seeking post-discharge placement for this patient at the following level of care:   SKILLED NURSING   (*CSW will update this form in Epic as items are completed)   07/20/2013  Patient/family provided with [facility] Department of Clinical Social Work's list of facilities offering this level of care within the geographic area requested by the patient (or if unable, by the patient's family).  07/20/2013  Patient/family informed of their freedom to choose among providers that offer the needed level of care, that participate in Medicare, Medicaid or managed care program needed by the patient, have an available bed and are willing to accept the patient.  07/20/2013  Patient/family informed of [facility]' ownership interest in [facility], as well as of the fact that they are under no obligation to receive care at this facility.  PASARR submitted to E[employer] on 07/20/2013 PASARR number received from [employer] on 07/20/2013  FL2 transmitted to all facilities in geographic area requested by pt/family on  07/20/2013 FL2 transmitted to all facilities within larger geographic area on   Patient informed that his/her managed care company has contracts with or will negotiate with  certain facilities, including the following:     Patient/family informed of bed offers received:  07/22/2013 Patient chooses bed at [facility] Physician recommends and patient chooses bed at    Patient to be transferred to  on   Patient to be transferred to facility by   The following physician request were entered in Epic:   Additional Comments: Patient does not want to return to [facility].  Has preexisitng PASARR. At  discharge, family chose to take patient home w hospice support.  Facility notified.  Santa Genera, LCSW Clinical Social Worker [phone])

## 2013-07-20 NOTE — Progress Notes (Signed)
[facility] PROGRESS NOTE  Laurie Ramirez QMV:[id number] DOB: 11-04-1931 DOA: 07/17/2013 PCP: Colette Ribas, MD  Assessment/Plan: 1. Atrial fibrillation with rapid ventricular response. May have been precipitated by acute decompensated CHF. Initially placed on a Cardizem drip, added oral Cardizem at 240 mg by mouth daily. Continue amiodarone 200 mg daily and metoprolol 50 mg twice a day. HR remains rate controlled. Plan to wean Cardizem gtt to off. 2. Acute on chronic combined systolic and diastolic congestive heart failure. She transthoracic echocardiogram performed on 07/11/2013 which showed an ejection fraction of 45-50%, diffuse hypokinesis with grade 2 diastolic dysfunction. Diuresis well overnight with IV Lasix. She was transitioned to oral Lasix at 40 mg by mouth twice a day. Fluid balance, net -662 cc over 24hrs. 3. Leukocytosis. Patient's white count was rising, stable at around 18k. Unclear source of infection, one out of two blood cultures growing gram-positive cocci in chains from 11/23 for which repeat blood cultures are neg x2 thus far. Organism identification and susceptibility testing in progress. Will continue patient on empiric IV antibiotic therapy with IV vancomycin and ceftriaxone.  4. Hypertension. Blood pressure stable, continue Cardizem 240 mg by mouth daily and metoprolol 50 mg by mouth twice a day. 5. Dyslipidemia. Continue statin therapy.  6. Hypokalemia. Likely secondary to diuretic therapy. Will administer KCl PO times 3 doses.  7. History of CVA. Recent discharge from [facility], CT scan performed on 07/12/2013 showing large region of hypodensity in the left MCA distribution compatible with MCA infarct. Likely secondary to thromboembolic phenomenon from atrial fibrillation. Continue statin and Eliquis.   Code Status: Full Family Communication: Pt in room (indicate person spoken with, relationship, and if by phone, the number) Disposition Plan:  Pending  Antibiotics:  Vancomycin 07/18/13>>>  Rocephin 07/18/13>>>  HPI/Subjective: No acute events noted overnight  Objective: Filed Vitals:   07/20/13 0300 07/20/13 0400 07/20/13 0500 07/20/13 0600  BP: 134/63 130/51 148/72 141/51  Pulse:      Temp:  97.6 F (36.4 C)    TempSrc:  Axillary    Resp: 24 22 24 21   Height:      Weight:   97.2 kg (214 lb 4.6 oz)   SpO2:        Intake/Output Summary (Last 24 hours) at 07/20/13 0825 Last data filed at 07/20/13 0600  Gross per 24 hour  Intake 437.91 ml  Output   1100 ml  Net -662.09 ml   Filed Weights   07/17/13 2100 07/18/13 0441 07/20/13 0500  Weight: 99 kg (218 lb 4.1 oz) 99.3 kg (218 lb 14.7 oz) 97.2 kg (214 lb 4.6 oz)    Exam:   General:  Awake, in nad  Cardiovascular: irregularly, irregular  Respiratory: normal resp effort, no wheezing  Abdomen: soft, nondistended  Musculoskeletal: perfused, no clubbing   Data Reviewed: Basic Metabolic Panel:  Recent Labs Lab 07/14/13 0330 07/15/13 0329 07/16/13 0615 07/17/13 1755 07/18/13 0500 07/19/13 0505 07/20/13 0547  NA 145 148* 150* 147* 153* 152* 154*  K 2.9* 3.1* 3.6 4.0 3.5 2.8* 3.5  CL 105 109 112 111 111 110 111  CO2 26 29 28 25  32 31 33*  GLUCOSE 174* 138* 156* 155* 171* 152* 144*  BUN 20 18 22  31* 31* 29* 26*  CREATININE 0.97 0.82 0.91 0.96 1.05 1.01 0.94  CALCIUM 9.1 9.3 9.2 10.0 9.9 9.7 9.5  MG 1.2* 2.1  --   --   --   --   --  Liver Function Tests:  Recent Labs Lab 07/18/13 0500  AST 49*  ALT 19  ALKPHOS 151*  BILITOT 1.5*  PROT 7.0  ALBUMIN 2.3*   No results found for this basename: LIPASE, AMYLASE,  in the last 168 hours No results found for this basename: AMMONIA,  in the last 168 hours CBC:  Recent Labs Lab 07/17/13 1755 07/18/13 0500 07/19/13 0505 07/20/13 0547  WBC 11.3* 16.0* 18.3* 17.7*  HGB 15.6* 16.5* 16.8* 16.4*  HCT 49.2* 51.8* 52.9* 53.4*  MCV 96.3 97.6 96.7 98.2  PLT 226 207 226 247   Cardiac  Enzymes:  Recent Labs Lab 07/17/13 1755  TROPONINI <0.30   BNP (last 3 results)  Recent Labs  06/18/13 1015 06/27/13 1618  PROBNP 11470.0* 2520.0*   CBG: No results found for this basename: GLUCAP,  in the last 168 hours  Recent Results (from the past 240 hour(s))  MRSA PCR SCREENING     Status: None   Collection Time    07/12/13  2:42 AM      Result Value Range Status   MRSA by PCR NEGATIVE  NEGATIVE Final   Comment:            The GeneXpert MRSA Assay (FDA     approved for NASAL specimens     only), is one component of a     comprehensive MRSA colonization     surveillance program. It is not     intended to diagnose MRSA     infection nor to guide or     monitor treatment for     MRSA infections.  CULTURE, BLOOD (ROUTINE X 2)     Status: None   Collection Time    07/17/13  5:54 PM      Result Value Range Status   Specimen Description BLOOD RIGHT ANTECUBITAL   Final   Special Requests BOTTLES DRAWN AEROBIC ONLY [facility]   Final   Culture  Setup Time     Final   Value: 07/18/2013 20:30     Performed at [facility]   Culture     Final   Value: GRAM POSITIVE COCCI IN CHAINS     Note: Gram Stain Report Called to,Read Back By and Verified With: Pasty Arch AT 2139 ON [id number] BY FORSYTH K     Performed at [facility]   Report Status PENDING   Incomplete  URINE CULTURE     Status: None   Collection Time    07/17/13  6:52 PM      Result Value Range Status   Specimen Description URINE, CATHETERIZED   Final   Special Requests NONE   Final   Culture  Setup Time     Final   Value: 07/17/2013 20:30     Performed at [facility] Count     Final   Value: 30,000 COLONIES/ML     Performed at [facility]   Culture     Final   Value: LACTOBACILLUS SPECIES     Note: Standardized susceptibility testing for this organism is not available.     Performed at [facility]   Report Status 07/19/2013 FINAL   Final  CULTURE, BLOOD  (ROUTINE X 2)     Status: None   Collection Time    07/17/13  6:55 PM      Result Value Range Status   Specimen Description BLOOD LEFT ANTECUBITAL DRAWN BY RN   Final  Special Requests BOTTLES DRAWN AEROBIC AND ANAEROBIC 8CC EACH   Final   Culture NO GROWTH 2 DAYS   Final   Report Status PENDING   Incomplete  MRSA PCR SCREENING     Status: Abnormal   Collection Time    07/17/13  8:04 PM      Result Value Range Status   MRSA by PCR POSITIVE (*) NEGATIVE Final   Comment:            The GeneXpert MRSA Assay (FDA     approved for NASAL specimens     only), is one component of a     comprehensive MRSA colonization     surveillance program. It is not     intended to diagnose MRSA     infection nor to guide or     monitor treatment for     MRSA infections.     RESULT CALLED TO, READ BACK BY AND VERIFIED WITH:     H. MCLEOD AT 2317 ON 07/17/13 BY S. VANHOORNE  CULTURE, BLOOD (ROUTINE X 2)     Status: None   Collection Time    07/19/13  9:29 AM      Result Value Range Status   Specimen Description Blood   Final   Special Requests NONE   Final   Culture NO GROWTH <24 HRS   Final   Report Status PENDING   Incomplete  CULTURE, BLOOD (ROUTINE X 2)     Status: None   Collection Time    07/19/13  9:29 AM      Result Value Range Status   Specimen Description Blood   Final   Special Requests NONE   Final   Culture NO GROWTH <24 HRS   Final   Report Status PENDING   Incomplete     Studies: Dg Chest Port 1 View  07/19/2013   CLINICAL DATA:  Pneumonia.  EXAM: PORTABLE CHEST - 1 VIEW  COMPARISON:  07/17/2013.  FINDINGS: Previously identified pulmonary venous congestion and interstitial edema has resolved. Heart size remains prominent with normal pulmonary vascularity. No pleural effusion or focal infiltrate. Degenerative changes both shoulders.  IMPRESSION: Interim resolution of pulmonary venous congestion and interstitial pulmonary edema.   Electronically Signed   By: Maisie Fus  Register    On: 07/19/2013 15:02    Scheduled Meds: . amiodarone  200 mg Oral Daily  . antiseptic oral rinse  15 mL Mouth Rinse BID  . apixaban  5 mg Oral BID  . atorvastatin  10 mg Oral q1800  . cefTRIAXone (ROCEPHIN)  IV  1 g Intravenous Q24H  . Chlorhexidine Gluconate Cloth  6 each Topical Q0600  . diltiazem  240 mg Oral Daily  . DULoxetine  30 mg Oral Daily  . furosemide  40 mg Oral BID  . metoprolol  50 mg Oral BID  . mupirocin ointment  1 application Nasal BID  . sodium chloride  3 mL Intravenous Q12H  . vancomycin  500 mg Intravenous Q12H   Continuous Infusions: . sodium chloride 10 mL/hr at 07/18/13 0700  . diltiazem (CARDIZEM) infusion 8 mg/hr (07/20/13 0600)    Principal Problem:   Acute on chronic diastolic CHF (congestive heart failure), NYHA class 2 Active Problems:   Atrial fibrillation   Atrial thrombus   CVA (cerebral infarction)  Time spent:  CHIU, STEPHEN K  [facility] Pager [phone]. If 7PM-7AM, please contact night-coverage at [email or web address], password [facility] 07/20/2013, 8:25 AM  LOS: 3 days

## 2013-07-20 NOTE — Evaluation (Signed)
Physical Therapy Evaluation Patient Details Name: Laurie Ramirez MRN: [id number] DOB: [birth year] Today's Date: 07/20/2013 Time: 9629-5284 PT Time Calculation (min): 41 min  PT Assessment / Plan / Recommendation History of Present Illness  Pt is admitted from SNF for acute on chronic CHF.  She sustained a massive left MCA stroke about 2 weeks ago and after treatment was transferred to [facility].  She was not even there a day before having to be admitted here for CHF.  Pt had no active motion of the right side, right sided neglect and expressive aphasia.  She has receptive deficit but does show some degree of understanding.  Clinical Impression   Pt is in the ICU with stable vital signs.  She is awake and able to follow simple commands about 5% of the time.  She continues to have right sided neglect in supine with flaccid right UE/LE.  She was transferred to a chair with a full lift and this facilitated head turns to the right more frequently.  She has massive deficits from this stroke, however since it has only been a couple of weeks since that event, she could definitely get return of function.  I would recommend continuing SNF to give her this chance.    PT Assessment  All further PT needs can be met in the next venue of care    Follow Up Recommendations  SNF    Does the patient have the potential to tolerate intense rehabilitation      Barriers to Discharge        Equipment Recommendations  None recommended by PT    Recommendations for Other Services OT consult;Speech consult   Frequency      Precautions / Restrictions Precautions Precautions: Fall Restrictions Weight Bearing Restrictions: No   Pertinent Vitals/Pain       Mobility  Bed Mobility Rolling Right: 1: +2 Total assist Rolling Left: 1: +2 Total assist Details for Bed Mobility Assistance: not tested as pt requires total assist....it was elected to use a full lift to transfer bed to chair    Exercises     PT  Diagnosis:    PT Problem List:   PT Treatment Interventions:       PT Goals(Current goals can be found in the care plan section) Acute Rehab PT Goals PT Goal Formulation: No goals set, d/c therapy  Visit Information  Last PT Received On: 07/20/13 History of Present Illness: Pt is admitted from SNF for acute on chronic CHF.  She sustained a massive left MCA stroke about 2 weeks ago and after treatment was transferred to [facility].  She was not even there a day before having to be admitted here for CHF.  Pt had no active motion of the right side, right sided neglect and expressive aphasia.  She has receptive deficit but does show some degree of understanding.       Prior Functioning  Home Living Family/patient expects to be discharged to:: Skilled nursing facility Prior Function Level of Independence: Independent Comments: Pt was fully independent prior to stroke 2 weeks ago Communication Communication: Receptive difficulties;Expressive difficulties    Cognition  Cognition Arousal/Alertness: Awake/alert Behavior During Therapy: Flat affect Overall Cognitive Status: Difficult to assess Following Commands: Follows one step commands inconsistently Difficult to assess due to: Hard of hearing/deaf    Extremity/Trunk Assessment Upper Extremity Assessment RUE Deficits / Details: flaccid UE Lower Extremity Assessment Lower Extremity Assessment: RLE deficits/detail RLE Deficits / Details: flaccid RLE   Balance Balance Balance Assessed:  No  End of Session PT - End of Session Activity Tolerance: Patient tolerated treatment well Patient left: in chair;with call bell/phone within reach;with family/visitor present Nurse Communication: Mobility status;Need for lift equipment  GP     Konrad Penta 07/20/2013, 12:40 PM

## 2013-07-21 DIAGNOSIS — I10 Essential (primary) hypertension: Secondary | ICD-10-CM

## 2013-07-21 LAB — BASIC METABOLIC PANEL
BUN: 21 mg/dL (ref 6–23)
CO2: 34 mEq/L — ABNORMAL HIGH (ref 19–32)
Chloride: 107 mEq/L (ref 96–112)
GFR calc non Af Amer: 68 mL/min — ABNORMAL LOW (ref >90)
Glucose, Bld: 176 mg/dL — ABNORMAL HIGH (ref 70–99)
Potassium: 3 mEq/L — ABNORMAL LOW (ref 3.5–5.1)
Sodium: 151 mEq/L — ABNORMAL HIGH (ref 135–145)

## 2013-07-21 LAB — GLUCOSE, CAPILLARY: Glucose-Capillary: 149 mg/dL — ABNORMAL HIGH (ref 70–99)

## 2013-07-21 MED ORDER — POTASSIUM CHLORIDE CRYS ER 20 MEQ PO TBCR
40.0000 meq | EXTENDED_RELEASE_TABLET | Freq: Every day | ORAL | Status: DC
  Administered 2013-07-22 – 2013-07-27 (×6): 40 meq via ORAL
  Filled 2013-07-21 (×7): qty 2

## 2013-07-21 MED ORDER — POTASSIUM CHLORIDE 10 MEQ/100ML IV SOLN
10.0000 meq | INTRAVENOUS | Status: AC
  Administered 2013-07-21 (×5): 10 meq via INTRAVENOUS
  Filled 2013-07-21: qty 100

## 2013-07-21 NOTE — Progress Notes (Signed)
Patient IV due to be changed per protocol. Patient has poor venous access, current IV working well.  MD orders OK to leave current IV in. Dressing and tubing changed.  Site WNL

## 2013-07-21 NOTE — Progress Notes (Signed)
[facility] PROGRESS NOTE  Laurie Ramirez:[id number] DOB: 1931/11/06 DOA: 07/17/2013 PCP: Colette Ribas, MD  Assessment/Plan: 1. Atrial fibrillation with rapid ventricular response. May have been precipitated by acute decompensated CHF. Initially placed on a Cardizem drip, since weaned to off, added oral Cardizem at 240 mg by mouth daily. Continue amiodarone 200 mg daily and metoprolol 50 mg twice a day. HR remains rate controlled. 2. Acute on chronic combined systolic and diastolic congestive heart failure. Transthoracic echocardiogram performed on 07/11/2013 which showed an ejection fraction of 45-50%, diffuse hypokinesis with grade 2 diastolic dysfunction. Diuresis well overnight with IV Lasix. She was transitioned to oral Lasix at 40 mg by mouth twice a day. Fluid balance, net -2183 cc over 24hrs. 3. Leukocytosis. Patient's white count was rising, stable at around 18k. Unclear source of infection, one out of two blood cultures growing gram-positive cocci in chains from 11/23 for which repeat blood cultures are neg x2 thus far. Organism identification and susceptibility testing in progress. Will continue patient on empiric IV antibiotic therapy with IV vancomycin and ceftriaxone. 4. Hypertension. Blood pressure stable, continue Cardizem 240 mg by mouth daily and metoprolol 50 mg by mouth twice a day. 5. Dyslipidemia. Continue statin therapy.  6. Hypokalemia. Likely secondary to diuretic therapy. Will administer KCl PO times 3 doses.  7. History of CVA. Recent discharge from [facility], CT scan performed on 07/12/2013 showing large region of hypodensity in the left MCA distribution compatible with MCA infarct. Likely secondary to thromboembolic phenomenon from atrial fibrillation. Continue statin and Eliquis.   Code Status: Full Family Communication: Pt in room (indicate person spoken with, relationship, and if by phone, the number) Disposition Plan:  Pending  Antibiotics:  Vancomycin 07/18/13>>>  Rocephin 07/18/13>>>  HPI/Subjective: No acute events noted overnight  Objective: Filed Vitals:   07/20/13 1800 07/20/13 1944 07/20/13 2044 07/21/13 0633  BP: 149/65  146/77 158/75  Pulse:   92 112  Temp:  97.6 F (36.4 C) 98.4 F (36.9 C) 97.4 F (36.3 C)  TempSrc:  Oral Oral Oral  Resp: 20  20 20   Height:      Weight:   96.5 kg (212 lb 11.9 oz)   SpO2:        Intake/Output Summary (Last 24 hours) at 07/21/13 1151 Last data filed at 07/21/13 0639  Gross per 24 hour  Intake    240 ml  Output   2550 ml  Net  -2310 ml   Filed Weights   07/18/13 0441 07/20/13 0500 07/20/13 2044  Weight: 99.3 kg (218 lb 14.7 oz) 97.2 kg (214 lb 4.6 oz) 96.5 kg (212 lb 11.9 oz)    Exam:   General:  Awake, in nad  Cardiovascular: irregularly, irregular  Respiratory: normal resp effort, no wheezing  Abdomen: soft, nondistended  Musculoskeletal: perfused, no clubbing   Data Reviewed: Basic Metabolic Panel:  Recent Labs Lab 07/15/13 0329  07/17/13 1755 07/18/13 0500 07/19/13 0505 07/20/13 0547 07/21/13 0628 07/21/13 0735  NA 148*  < > 147* 153* 152* 154* 151*  --   K 3.1*  < > 4.0 3.5 2.8* 3.5 3.0*  --   CL 109  < > 111 111 110 111 107  --   CO2 29  < > 25 32 31 33* 34*  --   GLUCOSE 138*  < > 155* 171* 152* 144* 176*  --   BUN 18  < > 31* 31* 29* 26* 21  --   CREATININE 0.82  < >  0.96 1.05 1.01 0.94 0.80  --   CALCIUM 9.3  < > 10.0 9.9 9.7 9.5 9.8  --   MG 2.1  --   --   --   --   --   --  1.5  < > = values in this interval not displayed. Liver Function Tests:  Recent Labs Lab 07/18/13 0500  AST 49*  ALT 19  ALKPHOS 151*  BILITOT 1.5*  PROT 7.0  ALBUMIN 2.3*   No results found for this basename: LIPASE, AMYLASE,  in the last 168 hours No results found for this basename: AMMONIA,  in the last 168 hours CBC:  Recent Labs Lab 07/17/13 1755 07/18/13 0500 07/19/13 0505 07/20/13 0547  WBC 11.3* 16.0* 18.3*  17.7*  HGB 15.6* 16.5* 16.8* 16.4*  HCT 49.2* 51.8* 52.9* 53.4*  MCV 96.3 97.6 96.7 98.2  PLT 226 207 226 247   Cardiac Enzymes:  Recent Labs Lab 07/17/13 1755  TROPONINI <0.30   BNP (last 3 results)  Recent Labs  06/18/13 1015 06/27/13 1618  PROBNP 11470.0* 2520.0*   CBG: No results found for this basename: GLUCAP,  in the last 168 hours  Recent Results (from the past 240 hour(s))  MRSA PCR SCREENING     Status: None   Collection Time    07/12/13  2:42 AM      Result Value Range Status   MRSA by PCR NEGATIVE  NEGATIVE Final   Comment:            The GeneXpert MRSA Assay (FDA     approved for NASAL specimens     only), is one component of a     comprehensive MRSA colonization     surveillance program. It is not     intended to diagnose MRSA     infection nor to guide or     monitor treatment for     MRSA infections.  CULTURE, BLOOD (ROUTINE X 2)     Status: None   Collection Time    07/17/13  5:54 PM      Result Value Range Status   Specimen Description BLOOD RIGHT ANTECUBITAL   Final   Special Requests BOTTLES DRAWN AEROBIC ONLY [facility]   Final   Culture  Setup Time     Final   Value: 07/18/2013 20:30     Performed at [facility]   Culture     Final   Value: STREPTOCOCCUS SPECIES     Note: Gram Stain Report Called to,Read Back By and Verified With: Pasty Arch AT 2139 ON [id number] BY FORSYTH K     Performed at [facility]   Report Status PENDING   Incomplete  URINE CULTURE     Status: None   Collection Time    07/17/13  6:52 PM      Result Value Range Status   Specimen Description URINE, CATHETERIZED   Final   Special Requests NONE   Final   Culture  Setup Time     Final   Value: 07/17/2013 20:30     Performed at [facility] Count     Final   Value: 30,000 COLONIES/ML     Performed at [facility]   Culture     Final   Value: LACTOBACILLUS SPECIES     Note: Standardized susceptibility testing for this organism is  not available.     Performed at [facility]   Report Status 07/19/2013  FINAL   Final  CULTURE, BLOOD (ROUTINE X 2)     Status: None   Collection Time    07/17/13  6:55 PM      Result Value Range Status   Specimen Description BLOOD LEFT ANTECUBITAL DRAWN BY RN   Final   Special Requests BOTTLES DRAWN AEROBIC AND ANAEROBIC 8CC EACH   Final   Culture NO GROWTH 3 DAYS   Final   Report Status PENDING   Incomplete  MRSA PCR SCREENING     Status: Abnormal   Collection Time    07/17/13  8:04 PM      Result Value Range Status   MRSA by PCR POSITIVE (*) NEGATIVE Final   Comment:            The GeneXpert MRSA Assay (FDA     approved for NASAL specimens     only), is one component of a     comprehensive MRSA colonization     surveillance program. It is not     intended to diagnose MRSA     infection nor to guide or     monitor treatment for     MRSA infections.     RESULT CALLED TO, READ BACK BY AND VERIFIED WITH:     H. MCLEOD AT 2317 ON 07/17/13 BY S. VANHOORNE  CULTURE, BLOOD (ROUTINE X 2)     Status: None   Collection Time    07/19/13  9:29 AM      Result Value Range Status   Specimen Description BLOOD RIGHT HAND   Final   Special Requests BOTTLES DRAWN AEROBIC ONLY 4CC   Final   Culture NO GROWTH 1 DAY   Final   Report Status PENDING   Incomplete  CULTURE, BLOOD (ROUTINE X 2)     Status: None   Collection Time    07/19/13  9:29 AM      Result Value Range Status   Specimen Description BLOOD RIGHT ANTECUBITAL   Final   Special Requests BOTTLES DRAWN AEROBIC AND ANAEROBIC 5CC   Final   Culture NO GROWTH 1 DAY   Final   Report Status PENDING   Incomplete     Studies: Dg Chest Port 1 View  07/19/2013   CLINICAL DATA:  Pneumonia.  EXAM: PORTABLE CHEST - 1 VIEW  COMPARISON:  07/17/2013.  FINDINGS: Previously identified pulmonary venous congestion and interstitial edema has resolved. Heart size remains prominent with normal pulmonary vascularity. No pleural effusion or  focal infiltrate. Degenerative changes both shoulders.  IMPRESSION: Interim resolution of pulmonary venous congestion and interstitial pulmonary edema.   Electronically Signed   By: Maisie Fus  Register   On: 07/19/2013 15:02    Scheduled Meds: . amiodarone  200 mg Oral Daily  . antiseptic oral rinse  15 mL Mouth Rinse BID  . apixaban  5 mg Oral BID  . atorvastatin  10 mg Oral q1800  . cefTRIAXone (ROCEPHIN)  IV  1 g Intravenous Q24H  . Chlorhexidine Gluconate Cloth  6 each Topical Q0600  . diltiazem  240 mg Oral Daily  . DULoxetine  30 mg Oral Daily  . furosemide  40 mg Oral BID  . metoprolol  50 mg Oral BID  . mupirocin ointment  1 application Nasal BID  . potassium chloride  10 mEq Intravenous Q1 Hr x 5  . [START ON 07/22/2013] potassium chloride  40 mEq Oral Daily  . sodium chloride  3 mL Intravenous Q12H  . vancomycin  500  mg Intravenous Q12H   Continuous Infusions: . sodium chloride 500 mL (07/21/13 0536)    Principal Problem:   Acute on chronic diastolic CHF (congestive heart failure), NYHA class 2 Active Problems:   Atrial fibrillation   Atrial thrombus   CVA (cerebral infarction)  Time spent:  CHIU, STEPHEN K  [facility] Pager [phone]. If 7PM-7AM, please contact night-coverage at [email or web address], password [facility] 07/21/2013, 11:51 AM  LOS: 4 days

## 2013-07-22 LAB — BASIC METABOLIC PANEL
BUN: 30 mg/dL — ABNORMAL HIGH (ref 6–23)
Chloride: 106 mEq/L (ref 96–112)
GFR calc Af Amer: 52 mL/min — ABNORMAL LOW (ref >90)
GFR calc non Af Amer: 45 mL/min — ABNORMAL LOW (ref >90)
Glucose, Bld: 170 mg/dL — ABNORMAL HIGH (ref 70–99)
Potassium: 3.1 mEq/L — ABNORMAL LOW (ref 3.5–5.1)
Sodium: 150 mEq/L — ABNORMAL HIGH (ref 135–145)

## 2013-07-22 LAB — CBC
HCT: 50.3 % — ABNORMAL HIGH (ref 36.0–46.0)
Hemoglobin: 15.9 g/dL — ABNORMAL HIGH (ref 12.0–15.0)
Platelets: 276 10*3/uL (ref 150–400)
RBC: 5.22 MIL/uL — ABNORMAL HIGH (ref 3.87–5.11)
WBC: 17.3 10*3/uL — ABNORMAL HIGH (ref 4.0–10.5)

## 2013-07-22 LAB — CULTURE, BLOOD (ROUTINE X 2): Culture: NO GROWTH

## 2013-07-22 LAB — VANCOMYCIN, TROUGH: Vancomycin Tr: 16.5 ug/mL (ref 10.0–20.0)

## 2013-07-22 MED ORDER — SODIUM CHLORIDE 0.9 % IV SOLN
INTRAVENOUS | Status: DC
  Administered 2013-07-22 – 2013-07-24 (×2): via INTRAVENOUS

## 2013-07-22 MED ORDER — DILTIAZEM HCL ER COATED BEADS 240 MG PO CP24: 240.0000 mg | ORAL_CAPSULE | Freq: Every day | ORAL | Status: AC

## 2013-07-22 MED ORDER — POTASSIUM CHLORIDE CRYS ER 20 MEQ PO TBCR: 40.0000 meq | EXTENDED_RELEASE_TABLET | Freq: Every day | ORAL | Status: AC

## 2013-07-22 NOTE — Clinical Social Work Note (Signed)
Family given bed offers, guardian chose [facility] in [place].  Facility notified by VM and asked to obtain authorization.  Santa Genera, LCSW Clinical Social Worker [phone])

## 2013-07-22 NOTE — Progress Notes (Signed)
Pt very lethargic this morning. It took multiple sternal rubs and coaxing from family for pt to wake up enough to take her medications. Family is worried and states that she is much more tired than usual. I did explain that her recent stroke may be a factor in the pt's presentation. Family asks that I page MD. Will notify MD and will continue to monitor.

## 2013-07-22 NOTE — Progress Notes (Signed)
[facility] PROGRESS NOTE  Laurie Ramirez:[id number] DOB: [birth year] DOA: 07/17/2013 PCP: Colette Ribas, MD  Assessment/Plan: 1. Atrial fibrillation with rapid ventricular response. May have been precipitated by acute decompensated CHF. Initially placed on a Cardizem drip, since weaned to off, added oral Cardizem at 240 mg by mouth daily. Continue amiodarone 200 mg daily and metoprolol 50 mg twice a day. HR remains rate controlled. 2. Acute on chronic combined systolic and diastolic congestive heart failure. Transthoracic echocardiogram performed on 07/11/2013 which showed an ejection fraction of 45-50%, diffuse hypokinesis with grade 2 diastolic dysfunction. Transitioned to BID PO lasix. Net -9L since admit. Now, clinically appears somewhat dehydrated, therefore, will hold further diuretics and cont on gentle IVF briefly. 3. Leukocytosis. Patient's white count at around 18k. Unclear source of infection, one out of two blood cultures growing gram-positive cocci in chains from 11/23 for which repeat blood cultures are neg x2 thus far. Organism identification and susceptibility testing in progress. Will continue patient on empiric IV antibiotic therapy with IV vancomycin and ceftriaxone. 4. Hypertension. Blood pressure stable, continue Cardizem 240 mg by mouth daily and metoprolol 50 mg by mouth twice a day. 5. Dyslipidemia. Continue statin therapy.  6. Hypokalemia. Correct as needed 7. History of CVA. Recent discharge from [facility], CT scan performed on 07/12/2013 showing large region of hypodensity in the left MCA distribution compatible with MCA infarct. Likely secondary to thromboembolic phenomenon from atrial fibrillation. Continue statin and Eliquis.   Code Status: Full Family Communication: Pt in room (indicate person spoken with, relationship, and if by phone, the number) Disposition Plan: Pending  Antibiotics:  Vancomycin 07/18/13>>>  Rocephin  07/18/13>>>  HPI/Subjective: Noted to be more tired appearing.  Objective: Filed Vitals:   07/20/13 2044 07/21/13 0633 07/21/13 1436 07/21/13 2133  BP: 146/77 158/75 117/75 144/69  Pulse: 92 112 85 84  Temp: 98.4 F (36.9 C) 97.4 F (36.3 C) 98.9 F (37.2 C) 98 F (36.7 C)  TempSrc: Oral Oral Axillary Oral  Resp: 20 20 20 20   Height:      Weight: 96.5 kg (212 lb 11.9 oz)   96 kg (211 lb 10.3 oz)  SpO2:   98% 96%    Intake/Output Summary (Last 24 hours) at 07/22/13 1217 Last data filed at 07/22/13 0049  Gross per 24 hour  Intake    400 ml  Output    300 ml  Net    100 ml   Filed Weights   07/20/13 0500 07/20/13 2044 07/21/13 2133  Weight: 97.2 kg (214 lb 4.6 oz) 96.5 kg (212 lb 11.9 oz) 96 kg (211 lb 10.3 oz)    Exam:   General:  Awake, in nad  Cardiovascular: irregularly, irregular  Respiratory: normal resp effort, no wheezing  Abdomen: soft, nondistended  Musculoskeletal: perfused, no clubbing   Data Reviewed: Basic Metabolic Panel:  Recent Labs Lab 07/18/13 0500 07/19/13 0505 07/20/13 0547 07/21/13 0628 07/21/13 0735 07/22/13 0543  NA 153* 152* 154* 151*  --  150*  K 3.5 2.8* 3.5 3.0*  --  3.1*  CL 111 110 111 107  --  106  CO2 32 31 33* 34*  --  31  GLUCOSE 171* 152* 144* 176*  --  170*  BUN 31* 29* 26* 21  --  30*  CREATININE 1.05 1.01 0.94 0.80  --  1.14*  CALCIUM 9.9 9.7 9.5 9.8  --  9.5  MG  --   --   --   --  1.5  --    Liver Function Tests:  Recent Labs Lab 07/18/13 0500  AST 49*  ALT 19  ALKPHOS 151*  BILITOT 1.5*  PROT 7.0  ALBUMIN 2.3*   No results found for this basename: LIPASE, AMYLASE,  in the last 168 hours No results found for this basename: AMMONIA,  in the last 168 hours CBC:  Recent Labs Lab 07/17/13 1755 07/18/13 0500 07/19/13 0505 07/20/13 0547 07/22/13 0543  WBC 11.3* 16.0* 18.3* 17.7* 17.3*  HGB 15.6* 16.5* 16.8* 16.4* 15.9*  HCT 49.2* 51.8* 52.9* 53.4* 50.3*  MCV 96.3 97.6 96.7 98.2 96.4  PLT 226  207 226 247 276   Cardiac Enzymes:  Recent Labs Lab 07/17/13 1755  TROPONINI <0.30   BNP (last 3 results)  Recent Labs  06/18/13 1015 06/27/13 1618  PROBNP 11470.0* 2520.0*   CBG:  Recent Labs Lab 07/21/13 1658  GLUCAP 149*    Recent Results (from the past 240 hour(s))  CULTURE, BLOOD (ROUTINE X 2)     Status: None   Collection Time    07/17/13  5:54 PM      Result Value Range Status   Specimen Description BLOOD RIGHT ANTECUBITAL   Final   Special Requests BOTTLES DRAWN AEROBIC ONLY [facility]   Final   Culture  Setup Time     Final   Value: 07/18/2013 20:30     Performed at [facility]   Culture     Final   Value: STREPTOCOCCUS SPECIES     Note: Gram Stain Report Called to,Read Back By and Verified With: Pasty Arch AT 2139 ON [id number] BY FORSYTH K     Performed at [facility]   Report Status PENDING   Incomplete  URINE CULTURE     Status: None   Collection Time    07/17/13  6:52 PM      Result Value Range Status   Specimen Description URINE, CATHETERIZED   Final   Special Requests NONE   Final   Culture  Setup Time     Final   Value: 07/17/2013 20:30     Performed at [facility] Count     Final   Value: 30,000 COLONIES/ML     Performed at [facility]   Culture     Final   Value: LACTOBACILLUS SPECIES     Note: Standardized susceptibility testing for this organism is not available.     Performed at [facility]   Report Status 07/19/2013 FINAL   Final  CULTURE, BLOOD (ROUTINE X 2)     Status: None   Collection Time    07/17/13  6:55 PM      Result Value Range Status   Specimen Description BLOOD LEFT ANTECUBITAL DRAWN BY RN   Final   Special Requests BOTTLES DRAWN AEROBIC AND ANAEROBIC 8CC EACH   Final   Culture NO GROWTH 5 DAYS   Final   Report Status 07/22/2013 FINAL   Final  MRSA PCR SCREENING     Status: Abnormal   Collection Time    07/17/13  8:04 PM      Result Value Range Status   MRSA by PCR POSITIVE  (*) NEGATIVE Final   Comment:            The GeneXpert MRSA Assay (FDA     approved for NASAL specimens     only), is one component of a     comprehensive MRSA colonization  surveillance program. It is not     intended to diagnose MRSA     infection nor to guide or     monitor treatment for     MRSA infections.     RESULT CALLED TO, READ BACK BY AND VERIFIED WITH:     H. MCLEOD AT 2317 ON 07/17/13 BY S. VANHOORNE  CULTURE, BLOOD (ROUTINE X 2)     Status: None   Collection Time    07/19/13  9:29 AM      Result Value Range Status   Specimen Description BLOOD RIGHT HAND   Final   Special Requests BOTTLES DRAWN AEROBIC ONLY 4CC   Final   Culture NO GROWTH 3 DAYS   Final   Report Status PENDING   Incomplete  CULTURE, BLOOD (ROUTINE X 2)     Status: None   Collection Time    07/19/13  9:29 AM      Result Value Range Status   Specimen Description BLOOD RIGHT ANTECUBITAL   Final   Special Requests BOTTLES DRAWN AEROBIC AND ANAEROBIC 5CC   Final   Culture NO GROWTH 3 DAYS   Final   Report Status PENDING   Incomplete     Studies: No results found.  Scheduled Meds: . amiodarone  200 mg Oral Daily  . antiseptic oral rinse  15 mL Mouth Rinse BID  . apixaban  5 mg Oral BID  . atorvastatin  10 mg Oral q1800  . cefTRIAXone (ROCEPHIN)  IV  1 g Intravenous Q24H  . Chlorhexidine Gluconate Cloth  6 each Topical Q0600  . diltiazem  240 mg Oral Daily  . DULoxetine  30 mg Oral Daily  . metoprolol  50 mg Oral BID  . mupirocin ointment  1 application Nasal BID  . potassium chloride  40 mEq Oral Daily  . sodium chloride  3 mL Intravenous Q12H  . vancomycin  500 mg Intravenous Q12H   Continuous Infusions: . sodium chloride 500 mL (07/21/13 0536)  . sodium chloride      Principal Problem:   Acute on chronic diastolic CHF (congestive heart failure), NYHA class 2 Active Problems:   Atrial fibrillation   Atrial thrombus   CVA (cerebral infarction)  Time spent:  CHIU, STEPHEN  K  [facility] Pager [phone]. If 7PM-7AM, please contact night-coverage at [email or web address], password [facility] 07/22/2013, 12:17 PM  LOS: 5 days

## 2013-07-22 NOTE — Progress Notes (Signed)
ANTIBIOTIC CONSULT NOTE  Pharmacy Consult for vancomycin Indication: leukocytosis/?bactermia   No Known Allergies  Patient Measurements: Height: 5\' 4"  (162.6 cm) Weight: 211 lb 10.3 oz (96 kg) IBW/kg (Calculated) : 54.7 Adjusted Body Weight: 70kg   Vital Signs:   Intake/Output from previous day: 11/27 0701 - 11/28 0700 In: 400 [IV Piggyback:400] Out: 300 [Urine:300] Intake/Output from this shift:    Labs:  Recent Labs  07/20/13 0547 07/21/13 0628 07/22/13 0543  WBC 17.7*  --  17.3*  HGB 16.4*  --  15.9*  PLT 247  --  276  CREATININE 0.94 0.80 1.14*   Estimated Creatinine Clearance: 45 ml/min (by C-G formula based on Cr of 1.14).  Recent Labs  07/22/13 1130  VANCOTROUGH 16.5     Microbiology: Recent Results (from the past 720 hour(s))  MRSA PCR SCREENING     Status: None   Collection Time    07/12/13  2:42 AM      Result Value Range Status   MRSA by PCR NEGATIVE  NEGATIVE Final   Comment:            The GeneXpert MRSA Assay (FDA     approved for NASAL specimens     only), is one component of a     comprehensive MRSA colonization     surveillance program. It is not     intended to diagnose MRSA     infection nor to guide or     monitor treatment for     MRSA infections.  CULTURE, BLOOD (ROUTINE X 2)     Status: None   Collection Time    07/17/13  5:54 PM      Result Value Range Status   Specimen Description BLOOD RIGHT ANTECUBITAL   Final   Special Requests BOTTLES DRAWN AEROBIC ONLY [facility]   Final   Culture  Setup Time     Final   Value: 07/18/2013 20:30     Performed at [facility]   Culture     Final   Value: STREPTOCOCCUS SPECIES     Note: Gram Stain Report Called to,Read Back By and Verified With: Pasty Arch AT 2139 ON [id number] BY FORSYTH K     Performed at [facility]   Report Status PENDING   Incomplete  URINE CULTURE     Status: None   Collection Time    07/17/13  6:52 PM      Result Value Range Status   Specimen Description  URINE, CATHETERIZED   Final   Special Requests NONE   Final   Culture  Setup Time     Final   Value: 07/17/2013 20:30     Performed at [facility] Count     Final   Value: 30,000 COLONIES/ML     Performed at [facility]   Culture     Final   Value: LACTOBACILLUS SPECIES     Note: Standardized susceptibility testing for this organism is not available.     Performed at [facility]   Report Status 07/19/2013 FINAL   Final  CULTURE, BLOOD (ROUTINE X 2)     Status: None   Collection Time    07/17/13  6:55 PM      Result Value Range Status   Specimen Description BLOOD LEFT ANTECUBITAL DRAWN BY RN   Final   Special Requests BOTTLES DRAWN AEROBIC AND ANAEROBIC 8CC EACH   Final   Culture NO GROWTH 5  DAYS   Final   Report Status 07/22/2013 FINAL   Final  MRSA PCR SCREENING     Status: Abnormal   Collection Time    07/17/13  8:04 PM      Result Value Range Status   MRSA by PCR POSITIVE (*) NEGATIVE Final   Comment:            The GeneXpert MRSA Assay (FDA     approved for NASAL specimens     only), is one component of a     comprehensive MRSA colonization     surveillance program. It is not     intended to diagnose MRSA     infection nor to guide or     monitor treatment for     MRSA infections.     RESULT CALLED TO, READ BACK BY AND VERIFIED WITH:     H. MCLEOD AT 2317 ON 07/17/13 BY S. VANHOORNE  CULTURE, BLOOD (ROUTINE X 2)     Status: None   Collection Time    07/19/13  9:29 AM      Result Value Range Status   Specimen Description BLOOD RIGHT HAND   Final   Special Requests BOTTLES DRAWN AEROBIC ONLY 4CC   Final   Culture NO GROWTH 3 DAYS   Final   Report Status PENDING   Incomplete  CULTURE, BLOOD (ROUTINE X 2)     Status: None   Collection Time    07/19/13  9:29 AM      Result Value Range Status   Specimen Description BLOOD RIGHT ANTECUBITAL   Final   Special Requests BOTTLES DRAWN AEROBIC AND ANAEROBIC 5CC   Final   Culture NO GROWTH  3 DAYS   Final   Report Status PENDING   Incomplete    Medical History: Past Medical History  Diagnosis Date  . Essential hypertension, benign   . Atrial fibrillation   . Bronchitis   . Depression   . Chronic combined systolic and diastolic heart failure      LVEF 45-50%  . 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 Chronic systolic heart failure 06/18/2013    Medications:  Scheduled:  . amiodarone  200 mg Oral Daily  . antiseptic oral rinse  15 mL Mouth Rinse BID  . apixaban  5 mg Oral BID  . atorvastatin  10 mg Oral q1800  . cefTRIAXone (ROCEPHIN)  IV  1 g Intravenous Q24H  . Chlorhexidine Gluconate Cloth  6 each Topical Q0600  . diltiazem  240 mg Oral Daily  . DULoxetine  30 mg Oral Daily  . metoprolol  50 mg Oral BID  . mupirocin ointment  1 application Nasal BID  . potassium chloride  40 mEq Oral Daily  . sodium chloride  3 mL Intravenous Q12H  . vancomycin  500 mg Intravenous Q12H   Infusions:  . sodium chloride 500 mL (07/21/13 0536)  . sodium chloride 50 mL/hr at 07/22/13 1045   PRN: sodium chloride, acetaminophen, ondansetron (ZOFRAN) IV, ondansetron, sodium chloride  Assessment: 79yo female with + streptococci in 1/2 blood culture.  Repeat blood cx have show NGTD.   She was initially started on Rocephin for leukocytosis and Vancomycin added for +blood cx.  WBC remains elevated & essentially unchanged since admission.   Vancomycin trough level is within goal range today.  Renal function has been at patient's baseline, however noted slight jump in Scr today.    Goal of Therapy:  Vancomycin trough level 15-20 mcg/ml.  Plan:  1. Continue Vancomycin 500mg  IV q12h 2. Check weekly trough level as indicated 3. Monitor renal function and cx data   Laurie Ramirez 07/22/2013,12:55 PM

## 2013-07-22 NOTE — Clinical Social Work Note (Signed)
After review, [facility] declined to accept patient.  CSW spoke w guardian, chose [facility] instead.  [facility] admissions Maralyn Sago) notified, asked to contact patient's guardian Melody to discuss placement.  Santa Genera, LCSW Clinical Social Worker [phone])

## 2013-07-22 NOTE — Discharge Summary (Signed)
Physician Discharge Summary  Laurie Ramirez ZOX:[id number] DOB: July 19, 1932 DOA: 07/17/2013  PCP: Colette Ribas, MD  Admit date: 07/17/2013 Discharge date: 07/23/2013  Time spent: 35 minutes  Recommendations for Outpatient Follow-up:  1. Follow up with PCP in 1-2 weeks 2. Please follow up CBC and renal panel within one week  Discharge Diagnoses:  Principal Problem:   Acute on chronic diastolic CHF (congestive heart failure), NYHA class 2 Active Problems:   Atrial fibrillation   Atrial thrombus   CVA (cerebral infarction)   Discharge Condition: Improved  Diet recommendation: Heart healthy  Filed Weights   07/21/13 2133 07/22/13 2040 07/23/13 0532  Weight: 96 kg (211 lb 10.3 oz) 96.1 kg (211 lb 13.8 oz) 96.7 kg (213 lb 3 oz)    History of present illness:  79 year old female who was brought from [facility] for shortness of breath. Patient is nonverbal due to recent CVA. Patient was discharged from [facility] after patient was found to have CVA and atrial thrombus. As per daughter patient started having shortness of breath an irregular heartbeat social sent to the ED for further evaluation. Patient is unable to provide any history. She was seen by cardiology at [facility] and started on anticoagulation with eliquis. And the amiodarone was restarted for heart rate control.  Daughter at bedside says that she did not have nausea vomiting or diarrhea. She had fever.  Hospital Course:  1. Atrial fibrillation with rapid ventricular response. May have been precipitated by acute decompensated CHF. Initially placed on a Cardizem drip, since weaned to off, added oral Cardizem at 240 mg by mouth daily. Continue amiodarone 200 mg daily and metoprolol 50 mg twice a day. HR remains rate controlled. 2. Acute on chronic combined systolic and diastolic congestive heart failure. Transthoracic echocardiogram performed on 07/11/2013 which showed an ejection fraction of 45-50%, diffuse  hypokinesis with grade 2 diastolic dysfunction. Diuresis well with IV Lasix. She was transitioned to oral Lasix at 40 mg by mouth twice a day with a slight increase in BUN and Cr. Therefore, lasix will be decreased to 40mg  daily. Fluid balance, net -9L since admission. Of note, pt's dry wt is noted to be 96kg. 3. Leukocytosis. Patient's wbc was stable at around 18k. Unclear source. One out of two blood cultures growing gram-positive cocci in chains from 11/23 for which repeat blood cultures are neg x2 thus far. Suspect contamination. Had continued patient on empiric IV antibiotic therapy with IV vancomycin and ceftriaxone. Initial UA with trace leuk and blood noted. Question possible UTI. Urine cx notable only for 30,000 lactobacillus species. 4. Hypertension. Blood pressure stable, continue Cardizem 240 mg by mouth daily and metoprolol 50 mg by mouth twice a day. 5. Dyslipidemia. Continue statin therapy.  6. Hypokalemia. Likely secondary to diuretic therapy. Will continue daily replacement. 7. History of CVA. Recent discharge from [facility], CT scan performed on 07/12/2013 showing large region of hypodensity in the left MCA distribution compatible with MCA infarct. Likely secondary to thromboembolic phenomenon from atrial fibrillation. Continue statin and Eliquis.   Discharge Exam: Filed Vitals:   07/22/13 1437 07/22/13 2040 07/22/13 2055 07/23/13 0532  BP:   147/82 156/74  Pulse: 77  88 129  Temp: 97.3 F (36.3 C)  98.6 F (37 C) 98.1 F (36.7 C)  TempSrc: Oral  Oral Oral  Resp: 20  20 20   Height:      Weight:  96.1 kg (211 lb 13.8 oz)  96.7 kg (213 lb 3 oz)  SpO2: 97%  96% 95%    General: Awake, in nad Cardiovascular: irregularly irregular, s1, s2 Respiratory: normal resp effort, no wheezing  Discharge Instructions     Medication List    STOP taking these medications       lisinopril-hydrochlorothiazide 20-12.5 MG per tablet  Commonly known as:  PRINZIDE,ZESTORETIC       TAKE these medications       acetaminophen 500 MG tablet  Commonly known as:  TYLENOL  Take 1,000 mg by mouth every 6 (six) hours as needed for pain.     amiodarone 200 MG tablet  Commonly known as:  PACERONE  Take 1 tablet (200 mg total) by mouth daily.     apixaban 5 MG Tabs tablet  Commonly known as:  ELIQUIS  Take 1 tablet (5 mg total) by mouth 2 (two) times daily.     atorvastatin 10 MG tablet  Commonly known as:  LIPITOR  Take 1 tablet (10 mg total) by mouth daily at 6 PM.     diltiazem 240 MG 24 hr capsule  Commonly known as:  CARDIZEM CD  Take 1 capsule (240 mg total) by mouth daily.     DULoxetine 30 MG capsule  Commonly known as:  CYMBALTA  Take 30 mg by mouth daily.     furosemide 40 MG tablet  Commonly known as:  LASIX  Take 40 mg by mouth daily.     lisinopril 2.5 MG tablet  Commonly known as:  PRINIVIL,ZESTRIL  Take 1 tablet (2.5 mg total) by mouth daily.     metoprolol 50 MG tablet  Commonly known as:  LOPRESSOR  Take 1 tablet (50 mg total) by mouth 2 (two) times daily.     PATADAY 0.2 % Soln  Generic drug:  Olopatadine HCl  Apply 1 drop to eye daily as needed (allergies).     potassium chloride SA 20 MEQ tablet  Commonly known as:  K-DUR,KLOR-CON  Take 2 tablets (40 mEq total) by mouth daily.     senna 8.6 MG tablet  Commonly known as:  SENOKOT  Take 2 tablets by mouth 2 (two) times daily.     ZOFRAN 4 MG tablet  Generic drug:  ondansetron  Take 4 mg by mouth every 8 (eight) hours as needed for nausea or vomiting.       No Known Allergies Follow-up Information   Follow up with Colette Ribas, MD. Schedule an appointment as soon as possible for a visit in 1 week.   Specialty:  Family Medicine   Contact information:   [address] [place] [address] [phone]        The results of significant diagnostics from this hospitalization (including imaging, microbiology, ancillary and laboratory) are  listed below for reference.    Significant Diagnostic Studies: Ct Head Wo Contrast  07/12/2013   CLINICAL DATA:  TPA treatment, 24 hr followup.  EXAM: CT HEAD WITHOUT CONTRAST  TECHNIQUE: Contiguous axial images were obtained from the base of the skull through the vertex without intravenous contrast.  COMPARISON:  07/12/2013 at 12:28 a.m.  FINDINGS: Abnormal hypodensity and loss of gray-white differentiation in the left MCA distribution is compatible with a large left MCA distribution infarct. No hemorrhagic transformation observed.  Periventricular white matter and corona radiata hypodensities favor chronic ischemic microvascular white matter disease.  No midline shift. No ventricular effacement. No significant extra-axial fluid collection. No mass lesion noted.  IMPRESSION: 1. Large region of hypodensity  in the left MCA distribution compatible with MCA infarct. No hemorrhagic transformation observed.   Electronically Signed   By: Herbie Baltimore M.D.   On: 07/12/2013 22:59   Ct Head Wo Contrast  07/12/2013   CLINICAL DATA:  Altered mental status, currently not verbally responsive.  EXAM: CT HEAD WITHOUT CONTRAST  TECHNIQUE: Contiguous axial images were obtained from the base of the skull through the vertex without intravenous contrast.  COMPARISON:  05/17/2013  FINDINGS: The head is tilted and I suspect that fortuitous imaging cuts cause the left MCA to be slightly more readily apparent than the right. This is not thought to be specific for dense left MCA sign.  The brainstem, cerebellum, cerebral peduncles, thalamus, basal ganglia, basilar cisterns, and ventricular system appear within normal limits. Periventricular white matter and corona radiata hypodensities favor chronic ischemic microvascular white matter disease. No intracranial hemorrhage, mass lesion, or acute CVA. Prior left frontal scalp hematoma has essentially resolved.  IMPRESSION: 1. Periventricular white matter and corona radiata  hypodensities favor chronic ischemic microvascular white matter disease. No definite acute intracranial findings.   Electronically Signed   By: Herbie Baltimore M.D.   On: 07/12/2013 00:43   Dg Chest Port 1 View  07/19/2013   CLINICAL DATA:  Pneumonia.  EXAM: PORTABLE CHEST - 1 VIEW  COMPARISON:  07/17/2013.  FINDINGS: Previously identified pulmonary venous congestion and interstitial edema has resolved. Heart size remains prominent with normal pulmonary vascularity. No pleural effusion or focal infiltrate. Degenerative changes both shoulders.  IMPRESSION: Interim resolution of pulmonary venous congestion and interstitial pulmonary edema.   Electronically Signed   By: Maisie Fus  Register   On: 07/19/2013 15:02   Dg Chest Portable 1 View  07/17/2013   CLINICAL DATA:  Atrial fibrillation, tachypnea, shortness of breath  EXAM: PORTABLE CHEST - 1 VIEW  COMPARISON:  07/12/2013  FINDINGS: Moderate cardiac enlargement. Aortic arch calcification. Mild vascular congestion and interstitial prominence. No consolidation or effusion.  IMPRESSION: Cardiomegaly with vascular congestion.  Minimal interstitial edema.   Electronically Signed   By: Esperanza Heir M.D.   On: 07/17/2013 18:00   Dg Chest Port 1 View  07/12/2013   CLINICAL DATA:  Stroke.  EXAM: PORTABLE CHEST - 1 VIEW  COMPARISON:  June 27, 2013.  FINDINGS: Stable mild cardiomegaly. No acute pulmonary disease is noted. No pleural effusion or pneumothorax is noted. Bony thorax is intact.  IMPRESSION: No acute cardiopulmonary abnormality seen.   Electronically Signed   By: Roque Lias M.D.   On: 07/12/2013 18:42   Dg Chest Port 1 View  06/27/2013   CLINICAL DATA:  Weakness.  EXAM: PORTABLE CHEST - 1 VIEW  COMPARISON:  06/18/2013  FINDINGS: Lungs are hypoinflated without definite consolidation or effusion. There is stable cardiomegaly. There is calcification of the aortic arch. Moderate degenerative change of the glenohumeral joints bilaterally.  IMPRESSION:  Hypoinflation without acute cardiopulmonary disease.  Stable cardiomegaly.   Electronically Signed   By: Elberta Fortis M.D.   On: 06/27/2013 16:26   Dg Swallowing Func-speech Pathology  07/14/2013   Riley Nearing Deblois, CCC-SLP     07/14/2013  3:04 PM Objective Swallowing Evaluation: Modified Barium Swallowing Study   Patient Details  Name: Laurie Ramirez MRN: [id number] Date of Birth: [birth year]  Today's Date: 07/14/2013 Time: 1330-1400 SLP Time Calculation (min): 30 min  Past Medical History:  Past Medical History  Diagnosis Date  . Essential hypertension, benign   . Atrial fibrillation   . Bronchitis   .  Depression   . Chronic combined systolic and diastolic heart failure      LVEF 45-50%  . 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 Chronic systolic heart failure 06/18/2013   Past Surgical History:  Past Surgical History  Procedure Laterality Date  . Shoulder surgery      right   HPI:  79 year old female with a history of CHF, A. fib, presented to  [facility] on 11/14 with nausea and vomiting. She was being  treated for presumed pancreatitis and renal failure felt to be  due to nausea and vomiting.  She was seen by nursing at 9:30pm  and at that time appeared to be in her normal state talking and  moving her right side. Possible apical thrombus on echo with  signs and symptoms of a large left MCA infarct. She has received  IV TPA.     Assessment / Plan / Recommendation Clinical Impression  Dysphagia Diagnosis: Moderate oral phase dysphagia;Mild  pharyngeal phase dysphagia Clinical impression: Pt presents with moderate deficits,  primarily due to slow initiation of oral movement for bolus  transit. Pt has reduced opening of mouth for boluses, anterior  spillage. Pt requires spoon feeding. Despite weakness, oral  residuals are mild. There is a delayed intiation of the swallow  response; puree and honey thick liquids are tolerated well but  nectar thick liquids result in sensed aspiration before the  swallow. Strength of  pharyngeal funciton WNL, no pharyngeal  residuals observed. Recommend pt proceed with Dys 1/pudding thick  liquids, SLP will likely upgrade pt to honey thick liqiuds  tomorrow at bedside.     Treatment Recommendation  Therapy as outlined in treatment plan below    Diet Recommendation Dysphagia 1 (Puree);Pudding-thick liquid   Liquid Administration via: Spoon Medication Administration: Crushed with puree Supervision: Staff to assist with self feeding Compensations: Slow rate;Small sips/bites;Check for anterior loss Postural Changes and/or Swallow Maneuvers: Seated upright 90  degrees    Other  Recommendations Oral Care Recommendations: Oral care Q4  per protocol;Oral care BID Other Recommendations: Have oral suction available   Follow Up Recommendations  Inpatient Rehab    Frequency and Duration min 2x/week  2 weeks   Pertinent Vitals/Pain NA    SLP Swallow Goals     General HPI: 79 year old female with a history of CHF, A. fib,  presented to [facility] on 11/14 with nausea and vomiting. She  was being treated for presumed pancreatitis and renal failure  felt to be due to nausea and vomiting.  She was seen by nursing  at 9:30pm and at that time appeared to be in her normal state  talking and moving her right side. Possible apical thrombus on  echo with signs and symptoms of a large left MCA infarct. She has  received IV TPA. Type of Study: Modified Barium Swallowing Study Reason for Referral: Objectively evaluate swallowing function Diet Prior to this Study: Dysphagia 1 (puree);Pudding-thick  liquids Temperature Spikes Noted: No Respiratory Status: Nasal cannula History of Recent Intubation: No Behavior/Cognition: Alert Oral Cavity - Dentition: Dentures, top;Dentures, bottom Oral Motor / Sensory Function: Impaired - see Bedside swallow  eval Self-Feeding Abilities: Total assist Patient Positioning: Upright in chair Baseline Vocal Quality:  (does not initiate speech) Volitional Cough: Cognitively unable to elicit  Volitional Swallow: Unable to elicit    Reason for Referral Objectively evaluate swallowing function   Oral Phase Oral Preparation/Oral Phase Oral Phase: Impaired Oral - Honey Oral - Honey Teaspoon: Left anterior bolus loss;Right anterior  bolus loss;Holding of bolus;Delayed oral transit Oral - Nectar Oral - Nectar Teaspoon: Left anterior bolus loss;Right anterior  bolus loss;Holding of bolus;Delayed oral transit Oral - Solids Oral - Puree: Left anterior bolus loss;Right anterior bolus  loss;Holding of bolus;Delayed oral transit Oral - Pill: Holding of bolus   Pharyngeal Phase Pharyngeal Phase Pharyngeal Phase: Impaired Pharyngeal - Honey Pharyngeal - Honey Teaspoon: Delayed swallow initiation Pharyngeal - Nectar Pharyngeal - Nectar Teaspoon: Delayed swallow  initiation;Premature spillage to pyriform  sinuses;Penetration/Aspiration before swallow;Moderate aspiration Penetration/Aspiration details (nectar teaspoon): Material enters  airway, passes BELOW cords and not ejected out despite cough  attempt by patient Pharyngeal - Solids Pharyngeal - Puree: Delayed swallow initiation  Cervical Esophageal Phase    GO   Harlon Ditty, MA CCC-SLP [phone]            DeBlois, Riley Nearing 07/14/2013, 3:03 PM     Microbiology: Recent Results (from the past 240 hour(s))  CULTURE, BLOOD (ROUTINE X 2)     Status: None   Collection Time    07/17/13  5:54 PM      Result Value Range Status   Specimen Description BLOOD RIGHT ANTECUBITAL   Final   Special Requests BOTTLES DRAWN AEROBIC ONLY [facility]   Final   Culture  Setup Time     Final   Value: 07/18/2013 20:30     Performed at [facility]   Culture     Final   Value: STREPTOCOCCUS CONSTELLATUS/MILLERI     Note: Gram Stain Report Called to,Read Back By and Verified With: Pasty Arch AT 2139 ON [id number] BY FORSYTH K     Performed at [facility]   Report Status 07/23/2013 FINAL   Final   Organism ID, Bacteria STREPTOCOCCUS CONSTELLATUS/MILLERI   Final   URINE CULTURE     Status: None   Collection Time    07/17/13  6:52 PM      Result Value Range Status   Specimen Description URINE, CATHETERIZED   Final   Special Requests NONE   Final   Culture  Setup Time     Final   Value: 07/17/2013 20:30     Performed at [facility] Count     Final   Value: 30,000 COLONIES/ML     Performed at [facility]   Culture     Final   Value: LACTOBACILLUS SPECIES     Note: Standardized susceptibility testing for this organism is not available.     Performed at [facility]   Report Status 07/19/2013 FINAL   Final  CULTURE, BLOOD (ROUTINE X 2)     Status: None   Collection Time    07/17/13  6:55 PM      Result Value Range Status   Specimen Description BLOOD LEFT ANTECUBITAL DRAWN BY RN   Final   Special Requests BOTTLES DRAWN AEROBIC AND ANAEROBIC 8CC EACH   Final   Culture NO GROWTH 5 DAYS   Final   Report Status 07/22/2013 FINAL   Final  MRSA PCR SCREENING     Status: Abnormal   Collection Time    07/17/13  8:04 PM      Result Value Range Status   MRSA by PCR POSITIVE (*) NEGATIVE Final   Comment:            The GeneXpert MRSA Assay (FDA     approved for NASAL specimens     only), is one component of a  comprehensive MRSA colonization     surveillance program. It is not     intended to diagnose MRSA     infection nor to guide or     monitor treatment for     MRSA infections.     RESULT CALLED TO, READ BACK BY AND VERIFIED WITH:     H. MCLEOD AT 2317 ON 07/17/13 BY S. VANHOORNE  CULTURE, BLOOD (ROUTINE X 2)     Status: None   Collection Time    07/19/13  9:29 AM      Result Value Range Status   Specimen Description BLOOD RIGHT HAND   Final   Special Requests BOTTLES DRAWN AEROBIC ONLY 4CC   Final   Culture NO GROWTH 4 DAYS   Final   Report Status PENDING   Incomplete  CULTURE, BLOOD (ROUTINE X 2)     Status: None   Collection Time    07/19/13  9:29 AM      Result Value Range Status   Specimen  Description BLOOD RIGHT ANTECUBITAL   Final   Special Requests BOTTLES DRAWN AEROBIC AND ANAEROBIC 5CC   Final   Culture NO GROWTH 4 DAYS   Final   Report Status PENDING   Incomplete     Labs: Basic Metabolic Panel:  Recent Labs Lab 07/19/13 0505 07/20/13 0547 07/21/13 0628 07/21/13 0735 07/22/13 0543 07/23/13 0540  NA 152* 154* 151*  --  150* 152*  K 2.8* 3.5 3.0*  --  3.1* 3.2*  CL 110 111 107  --  106 110  CO2 31 33* 34*  --  31 31  GLUCOSE 152* 144* 176*  --  170* 149*  BUN 29* 26* 21  --  30* 30*  CREATININE 1.01 0.94 0.80  --  1.14* 1.03  CALCIUM 9.7 9.5 9.8  --  9.5 9.5  MG  --   --   --  1.5  --   --    Liver Function Tests:  Recent Labs Lab 07/18/13 0500  AST 49*  ALT 19  ALKPHOS 151*  BILITOT 1.5*  PROT 7.0  ALBUMIN 2.3*   No results found for this basename: LIPASE, AMYLASE,  in the last 168 hours No results found for this basename: AMMONIA,  in the last 168 hours CBC:  Recent Labs Lab 07/17/13 1755 07/18/13 0500 07/19/13 0505 07/20/13 0547 07/22/13 0543  WBC 11.3* 16.0* 18.3* 17.7* 17.3*  HGB 15.6* 16.5* 16.8* 16.4* 15.9*  HCT 49.2* 51.8* 52.9* 53.4* 50.3*  MCV 96.3 97.6 96.7 98.2 96.4  PLT 226 207 226 247 276   Cardiac Enzymes:  Recent Labs Lab 07/17/13 1755  TROPONINI <0.30   BNP: BNP (last 3 results)  Recent Labs  06/18/13 1015 06/27/13 1618  PROBNP 11470.0* 2520.0*   CBG:  Recent Labs Lab 07/21/13 1658  GLUCAP 149*   Signed:  Keneshia Tena K  [facility] 07/23/2013, 1:24 PM

## 2013-07-23 LAB — BASIC METABOLIC PANEL
Chloride: 110 mEq/L (ref 96–112)
GFR calc Af Amer: 58 mL/min — ABNORMAL LOW (ref >90)
GFR calc non Af Amer: 50 mL/min — ABNORMAL LOW (ref >90)
Glucose, Bld: 149 mg/dL — ABNORMAL HIGH (ref 70–99)
Potassium: 3.2 mEq/L — ABNORMAL LOW (ref 3.5–5.1)
Sodium: 152 mEq/L — ABNORMAL HIGH (ref 135–145)

## 2013-07-23 LAB — CULTURE, BLOOD (ROUTINE X 2)

## 2013-07-23 MED ORDER — DEXTROSE 5 % IV SOLN
5.0000 mg/h | INTRAVENOUS | Status: DC
  Administered 2013-07-23 – 2013-07-24 (×2): 5 mg/h via INTRAVENOUS
  Filled 2013-07-23: qty 100

## 2013-07-23 MED ORDER — LISINOPRIL 2.5 MG PO TABS: 2.5000 mg | ORAL_TABLET | Freq: Every day | ORAL | Status: DC

## 2013-07-23 MED ORDER — DILTIAZEM HCL ER COATED BEADS 180 MG PO CP24
300.0000 mg | ORAL_CAPSULE | Freq: Every day | ORAL | Status: DC
  Administered 2013-07-24 – 2013-07-25 (×2): 300 mg via ORAL
  Filled 2013-07-23 (×4): qty 1

## 2013-07-23 MED ORDER — DILTIAZEM HCL 25 MG/5ML IV SOLN
5.0000 mg | INTRAVENOUS | Status: AC
  Administered 2013-07-23: 5 mg via INTRAVENOUS
  Filled 2013-07-23 (×2): qty 5

## 2013-07-23 MED ORDER — LABETALOL HCL 5 MG/ML IV SOLN
5.0000 mg | INTRAVENOUS | Status: DC | PRN
  Administered 2013-07-23 (×4): 5 mg via INTRAVENOUS
  Filled 2013-07-23: qty 4

## 2013-07-23 MED ORDER — SODIUM CHLORIDE 0.9 % IV BOLUS (SEPSIS)
250.0000 mL | Freq: Once | INTRAVENOUS | Status: AC
  Administered 2013-07-23: 250 mL via INTRAVENOUS

## 2013-07-23 MED ORDER — SODIUM CHLORIDE 0.9 % IV SOLN: INTRAVENOUS | Status: AC

## 2013-07-23 MED ORDER — METOPROLOL TARTRATE 50 MG PO TABS
75.0000 mg | ORAL_TABLET | Freq: Two times a day (BID) | ORAL | Status: DC
  Administered 2013-07-23 – 2013-07-29 (×12): 75 mg via ORAL
  Filled 2013-07-23 (×24): qty 1

## 2013-07-23 MED ORDER — POTASSIUM CHLORIDE 10 MEQ/100ML IV SOLN
10.0000 meq | INTRAVENOUS | Status: AC
  Administered 2013-07-23 (×3): 10 meq via INTRAVENOUS
  Filled 2013-07-23: qty 100

## 2013-07-23 NOTE — Progress Notes (Signed)
[facility]ed [facility] [place] and gave report on pt. [facility] called back to say that they didn't actually have a bed available for pt as they had previously thought. Maralyn Sago from admissions called me to inform me that they are still waiting on insurance approval. If insurance is approved pt will be able to transfer on Monday. Will continue to monitor.

## 2013-07-23 NOTE — Progress Notes (Signed)
Pts HR has been in the 120s with nonsustained rates going into the 140s. MD is aware and ordered labetalol 5mg  Q . Pt received 4 doses over an hour and HR has not improved. MD has ordered a bolus of 250 and increase in fluid rate to 3ml/hr for 10 hrs as well as 5mg  of cardizem IVPB. Will follow orders and continue to monitor.

## 2013-07-23 NOTE — Progress Notes (Signed)
Pt's heart rate is in 140's.  Notified MD and transferring pt. To stepdown for cardizem drip.    Ellene Route 07/23/2013

## 2013-07-23 NOTE — Progress Notes (Signed)
[facility] PROGRESS NOTE  Laurie Ramirez NWG:[id number] DOB: [birth year] DOA: 07/17/2013 PCP: Colette Ribas, MD  Assessment/Plan: 1. Atrial fibrillation with rapid ventricular response. May have been precipitated by acute decompensated CHF. Initially placed on a Cardizem drip, since weaned to off, added oral Cardizem at 240 mg by mouth daily. Continue amiodarone 200 mg daily and metoprolol 50 mg twice a day. HR remains rate controlled. 2. Acute on chronic combined systolic and diastolic congestive heart failure. Transthoracic echocardiogram performed on 07/11/2013 which showed an ejection fraction of 45-50%, diffuse hypokinesis with grade 2 diastolic dysfunction. Transitioned to BID PO lasix. Net -9L since admit. Renal function began to decline so diuretics were held. Pt's dry weight is 96kg. 3. Leukocytosis. Patient's white count at around 18k. Unclear source of infection, one out of two blood cultures growing gram-positive cocci in chains from 11/23 for which repeat blood cultures are neg x2 thus far. Organism identification and susceptibility testing in progress. Will continue patient on empiric IV antibiotic therapy with IV vancomycin and ceftriaxone. 4. Hypertension. Blood pressure stable, continue Cardizem 240 mg by mouth daily and metoprolol 50 mg by mouth twice a day. 5. Dyslipidemia. Continue statin therapy.  6. Hypokalemia. Correct as needed 7. History of CVA. Recent discharge from [facility], CT scan performed on 07/12/2013 showing large region of hypodensity in the left MCA distribution compatible with MCA infarct. Likely secondary to thromboembolic phenomenon from atrial fibrillation. Continue statin and Eliquis.   Code Status: Full Family Communication: Pt in room (indicate person spoken with, relationship, and if by phone, the number) Disposition Plan: Pending placement  Antibiotics:  Vancomycin 07/18/13>>>  Rocephin 07/18/13>>>  HPI/Subjective: Per family,  mentation is improved today s/p gentle hydration yesterday.  Objective: Filed Vitals:   07/22/13 1437 07/22/13 2040 07/22/13 2055 07/23/13 0532  BP:   147/82 156/74  Pulse: 77  88 129  Temp: 97.3 F (36.3 C)  98.6 F (37 C) 98.1 F (36.7 C)  TempSrc: Oral  Oral Oral  Resp: 20  20 20   Height:      Weight:  96.1 kg (211 lb 13.8 oz)  96.7 kg (213 lb 3 oz)  SpO2: 97%  96% 95%    Intake/Output Summary (Last 24 hours) at 07/23/13 1024 Last data filed at 07/23/13 0500  Gross per 24 hour  Intake    120 ml  Output   1100 ml  Net   -980 ml   Filed Weights   07/21/13 2133 07/22/13 2040 07/23/13 0532  Weight: 96 kg (211 lb 10.3 oz) 96.1 kg (211 lb 13.8 oz) 96.7 kg (213 lb 3 oz)    Exam:   General:  Awake, in nad  Cardiovascular: irregularly, irregular  Respiratory: normal resp effort, no wheezing  Abdomen: soft, nondistended  Musculoskeletal: perfused, no clubbing   Data Reviewed: Basic Metabolic Panel:  Recent Labs Lab 07/19/13 0505 07/20/13 0547 07/21/13 0628 07/21/13 0735 07/22/13 0543 07/23/13 0540  NA 152* 154* 151*  --  150* 152*  K 2.8* 3.5 3.0*  --  3.1* 3.2*  CL 110 111 107  --  106 110  CO2 31 33* 34*  --  31 31  GLUCOSE 152* 144* 176*  --  170* 149*  BUN 29* 26* 21  --  30* 30*  CREATININE 1.01 0.94 0.80  --  1.14* 1.03  CALCIUM 9.7 9.5 9.8  --  9.5 9.5  MG  --   --   --  1.5  --   --  Liver Function Tests:  Recent Labs Lab 07/18/13 0500  AST 49*  ALT 19  ALKPHOS 151*  BILITOT 1.5*  PROT 7.0  ALBUMIN 2.3*   No results found for this basename: LIPASE, AMYLASE,  in the last 168 hours No results found for this basename: AMMONIA,  in the last 168 hours CBC:  Recent Labs Lab 07/17/13 1755 07/18/13 0500 07/19/13 0505 07/20/13 0547 07/22/13 0543  WBC 11.3* 16.0* 18.3* 17.7* 17.3*  HGB 15.6* 16.5* 16.8* 16.4* 15.9*  HCT 49.2* 51.8* 52.9* 53.4* 50.3*  MCV 96.3 97.6 96.7 98.2 96.4  PLT 226 207 226 247 276   Cardiac Enzymes:  Recent  Labs Lab 07/17/13 1755  TROPONINI <0.30   BNP (last 3 results)  Recent Labs  06/18/13 1015 06/27/13 1618  PROBNP 11470.0* 2520.0*   CBG:  Recent Labs Lab 07/21/13 1658  GLUCAP 149*    Recent Results (from the past 240 hour(s))  CULTURE, BLOOD (ROUTINE X 2)     Status: None   Collection Time    07/17/13  5:54 PM      Result Value Range Status   Specimen Description BLOOD RIGHT ANTECUBITAL   Final   Special Requests BOTTLES DRAWN AEROBIC ONLY [facility]   Final   Culture  Setup Time     Final   Value: 07/18/2013 20:30     Performed at [facility]   Culture     Final   Value: STREPTOCOCCUS CONSTELLATUS/MILLERI     Note: Gram Stain Report Called to,Read Back By and Verified With: Pasty Arch AT 2139 ON [id number] BY FORSYTH K     Performed at [facility]   Report Status 07/23/2013 FINAL   Final   Organism ID, Bacteria STREPTOCOCCUS CONSTELLATUS/MILLERI   Final  URINE CULTURE     Status: None   Collection Time    07/17/13  6:52 PM      Result Value Range Status   Specimen Description URINE, CATHETERIZED   Final   Special Requests NONE   Final   Culture  Setup Time     Final   Value: 07/17/2013 20:30     Performed at [facility] Count     Final   Value: 30,000 COLONIES/ML     Performed at [facility]   Culture     Final   Value: LACTOBACILLUS SPECIES     Note: Standardized susceptibility testing for this organism is not available.     Performed at [facility]   Report Status 07/19/2013 FINAL   Final  CULTURE, BLOOD (ROUTINE X 2)     Status: None   Collection Time    07/17/13  6:55 PM      Result Value Range Status   Specimen Description BLOOD LEFT ANTECUBITAL DRAWN BY RN   Final   Special Requests BOTTLES DRAWN AEROBIC AND ANAEROBIC 8CC EACH   Final   Culture NO GROWTH 5 DAYS   Final   Report Status 07/22/2013 FINAL   Final  MRSA PCR SCREENING     Status: Abnormal   Collection Time    07/17/13  8:04 PM      Result Value  Range Status   MRSA by PCR POSITIVE (*) NEGATIVE Final   Comment:            The GeneXpert MRSA Assay (FDA     approved for NASAL specimens     only), is one component of a  comprehensive MRSA colonization     surveillance program. It is not     intended to diagnose MRSA     infection nor to guide or     monitor treatment for     MRSA infections.     RESULT CALLED TO, READ BACK BY AND VERIFIED WITH:     H. MCLEOD AT 2317 ON 07/17/13 BY S. VANHOORNE  CULTURE, BLOOD (ROUTINE X 2)     Status: None   Collection Time    07/19/13  9:29 AM      Result Value Range Status   Specimen Description BLOOD RIGHT HAND   Final   Special Requests BOTTLES DRAWN AEROBIC ONLY 4CC   Final   Culture NO GROWTH 4 DAYS   Final   Report Status PENDING   Incomplete  CULTURE, BLOOD (ROUTINE X 2)     Status: None   Collection Time    07/19/13  9:29 AM      Result Value Range Status   Specimen Description BLOOD RIGHT ANTECUBITAL   Final   Special Requests BOTTLES DRAWN AEROBIC AND ANAEROBIC 5CC   Final   Culture NO GROWTH 4 DAYS   Final   Report Status PENDING   Incomplete     Studies: No results found.  Scheduled Meds: . amiodarone  200 mg Oral Daily  . antiseptic oral rinse  15 mL Mouth Rinse BID  . apixaban  5 mg Oral BID  . atorvastatin  10 mg Oral q1800  . cefTRIAXone (ROCEPHIN)  IV  1 g Intravenous Q24H  . diltiazem  240 mg Oral Daily  . DULoxetine  30 mg Oral Daily  . metoprolol  50 mg Oral BID  . potassium chloride  40 mEq Oral Daily  . sodium chloride  3 mL Intravenous Q12H  . vancomycin  500 mg Intravenous Q12H   Continuous Infusions: . sodium chloride 500 mL (07/21/13 0536)  . sodium chloride 50 mL/hr at 07/22/13 2041    Principal Problem:   Acute on chronic diastolic CHF (congestive heart failure), NYHA class 2 Active Problems:   Atrial fibrillation   Atrial thrombus   CVA (cerebral infarction)  Time spent:  CHIU, STEPHEN K  [facility] Pager [phone]. If  7PM-7AM, please contact night-coverage at [email or web address], password [facility] 07/23/2013, 10:24 AM  LOS: 6 days

## 2013-07-23 NOTE — Progress Notes (Signed)
Pt's discharge cancelled, as pt does not have available bed.

## 2013-07-23 NOTE — Progress Notes (Signed)
Pt's HR is in the 130s/140s this morning. Gave pt her beta blocker and calcium channel blocker. Will continue to monitor pt's HR.

## 2013-07-23 NOTE — Progress Notes (Signed)
Report given to Casimiro Needle and patient transferred to ICU.    Laurie Ramirez 07/23/2013

## 2013-07-24 ENCOUNTER — Inpatient Hospital Stay (HOSPITAL_COMMUNITY): Payer: Medicare Other

## 2013-07-24 LAB — CULTURE, BLOOD (ROUTINE X 2): Culture: NO GROWTH

## 2013-07-24 LAB — BASIC METABOLIC PANEL
BUN: 30 mg/dL — ABNORMAL HIGH (ref 6–23)
CO2: 28 mEq/L (ref 19–32)
Chloride: 113 mEq/L — ABNORMAL HIGH (ref 96–112)
Creatinine, Ser: 1.05 mg/dL (ref 0.50–1.10)
GFR calc Af Amer: 57 mL/min — ABNORMAL LOW (ref >90)
Potassium: 3.6 mEq/L (ref 3.5–5.1)
Sodium: 152 mEq/L — ABNORMAL HIGH (ref 135–145)

## 2013-07-24 MED ORDER — LUBIPROSTONE 24 MCG PO CAPS
24.0000 ug | ORAL_CAPSULE | Freq: Two times a day (BID) | ORAL | Status: DC
  Administered 2013-07-24 – 2013-07-28 (×8): 24 ug via ORAL
  Filled 2013-07-24 (×10): qty 1

## 2013-07-24 MED ORDER — FUROSEMIDE 40 MG PO TABS
40.0000 mg | ORAL_TABLET | Freq: Every day | ORAL | Status: DC
  Administered 2013-07-24 – 2013-07-29 (×6): 40 mg via ORAL
  Filled 2013-07-24 (×6): qty 1

## 2013-07-24 MED ORDER — SORBITOL 70 % SOLN
960.0000 mL | TOPICAL_OIL | ORAL | Status: AC
  Administered 2013-07-24: 960 mL via RECTAL
  Filled 2013-07-24: qty 240

## 2013-07-24 MED ORDER — SORBITOL 70 % SOLN
30.0000 mL | Status: AC
  Administered 2013-07-24: 30 mL via ORAL

## 2013-07-24 MED ORDER — SORBITOL 70 % SOLN: 30.0000 mL | Status: DC

## 2013-07-24 MED ORDER — PEG 3350-KCL-NA BICARB-NACL 420 G PO SOLR
4000.0000 mL | Freq: Once | ORAL | Status: DC
  Filled 2013-07-24: qty 4000

## 2013-07-24 MED ORDER — LUBIPROSTONE 24 MCG PO CAPS
ORAL_CAPSULE | ORAL | Status: AC
  Filled 2013-07-24: qty 1

## 2013-07-24 NOTE — Progress Notes (Signed)
[facility] PROGRESS NOTE  Laurie Ramirez WUJ:[id number] DOB: 22-Dec-1931 DOA: 07/17/2013 PCP: Colette Ribas, MD  Assessment/Plan: 1. Atrial fibrillation with rapid ventricular response. May have been precipitated by acute decompensated CHF. Initially placed on a Cardizem drip then weaned to off with oral Cardizem at 240 mg by mouth daily. Continued amiodarone 200 mg daily and metoprolol 50 mg twice a day. HR had initially been rate controlled. However, pt developed RVR overnight, failed IV beta blocker and IV cardizem boluses. Pt has since been continued on cardizem gtt once again. May consider Cardiology assistance when available. 2. Acute on chronic combined systolic and diastolic congestive heart failure. Transthoracic echocardiogram performed on 07/11/2013 which showed an ejection fraction of 45-50%, diffuse hypokinesis with grade 2 diastolic dysfunction. Initially transitioned to BID PO lasix. Net -9L since admit. Renal function began to decline so diuretics were held. Pt's dry weight is 96kg. Would resume qd lasix 3. Leukocytosis. Patient's white count at around 18k. Unclear source, one out of two blood cultures growing gram-positive cocci in chains from 11/23 for which repeat blood cultures are neg x2 thus far, suspect contamination. Organism identification and susceptibility testing in progress. Will continue patient on empiric IV antibiotic therapy with IV vancomycin and ceftriaxone for now. 4. Hypertension. Blood pressure stable, continue Cardizem 240 mg by mouth daily and metoprolol 50 mg by mouth twice a day. 5. Dyslipidemia. Continue statin therapy.  6. Hypokalemia. Correct as needed 7. History of CVA. Recent discharge from [facility], CT scan performed on 07/12/2013 showing large region of hypodensity in the left MCA distribution compatible with MCA infarct. Likely secondary to thromboembolic phenomenon from atrial fibrillation. Continue statin and Eliquis.   Code Status:  Full Family Communication: Pt in room (indicate person spoken with, relationship, and if by phone, the number) Disposition Plan: Pending placement  Antibiotics:  Vancomycin 07/18/13>>>  Rocephin 07/18/13>>>  HPI/Subjective: Per family, mentation is improved today s/p gentle hydration yesterday.  Objective: Filed Vitals:   07/24/13 0615 07/24/13 0630 07/24/13 0645 07/24/13 0700  BP: 110/51 130/48 117/92 136/49  Pulse: 91 92 85 101  Temp:      TempSrc:      Resp: 23 25 25 20   Height:      Weight:      SpO2: 95% 97% 96% 97%    Intake/Output Summary (Last 24 hours) at 07/24/13 0821 Last data filed at 07/24/13 0746  Gross per 24 hour  Intake 1048.91 ml  Output    650 ml  Net 398.91 ml   Filed Weights   07/23/13 0532 07/23/13 2040 07/24/13 0500  Weight: 96.7 kg (213 lb 3 oz) 96.7 kg (213 lb 3 oz) 97.1 kg (214 lb 1.1 oz)    Exam:   General:  Awake, in nad  Cardiovascular: irregularly, irregular  Respiratory: normal resp effort, no wheezing  Abdomen: soft, nondistended  Musculoskeletal: perfused, no clubbing   Data Reviewed: Basic Metabolic Panel:  Recent Labs Lab 07/20/13 0547 07/21/13 0628 07/21/13 0735 07/22/13 0543 07/23/13 0540 07/24/13 0438  NA 154* 151*  --  150* 152* 152*  K 3.5 3.0*  --  3.1* 3.2* 3.6  CL 111 107  --  106 110 113*  CO2 33* 34*  --  31 31 28   GLUCOSE 144* 176*  --  170* 149* 182*  BUN 26* 21  --  30* 30* 30*  CREATININE 0.94 0.80  --  1.14* 1.03 1.05  CALCIUM 9.5 9.8  --  9.5 9.5 9.3  MG  --   --  1.5  --   --   --    Liver Function Tests:  Recent Labs Lab 07/18/13 0500  AST 49*  ALT 19  ALKPHOS 151*  BILITOT 1.5*  PROT 7.0  ALBUMIN 2.3*   No results found for this basename: LIPASE, AMYLASE,  in the last 168 hours No results found for this basename: AMMONIA,  in the last 168 hours CBC:  Recent Labs Lab 07/17/13 1755 07/18/13 0500 07/19/13 0505 07/20/13 0547 07/22/13 0543  WBC 11.3* 16.0* 18.3* 17.7* 17.3*   HGB 15.6* 16.5* 16.8* 16.4* 15.9*  HCT 49.2* 51.8* 52.9* 53.4* 50.3*  MCV 96.3 97.6 96.7 98.2 96.4  PLT 226 207 226 247 276   Cardiac Enzymes:  Recent Labs Lab 07/17/13 1755  TROPONINI <0.30   BNP (last 3 results)  Recent Labs  06/18/13 1015 06/27/13 1618  PROBNP 11470.0* 2520.0*   CBG:  Recent Labs Lab 07/21/13 1658  GLUCAP 149*    Recent Results (from the past 240 hour(s))  CULTURE, BLOOD (ROUTINE X 2)     Status: None   Collection Time    07/17/13  5:54 PM      Result Value Range Status   Specimen Description BLOOD RIGHT ANTECUBITAL   Final   Special Requests BOTTLES DRAWN AEROBIC ONLY [facility]   Final   Culture  Setup Time     Final   Value: 07/18/2013 20:30     Performed at [facility]   Culture     Final   Value: STREPTOCOCCUS CONSTELLATUS/MILLERI     Note: Gram Stain Report Called to,Read Back By and Verified With: Pasty Arch AT 2139 ON [id number] BY FORSYTH K     Performed at [facility]   Report Status 07/23/2013 FINAL   Final   Organism ID, Bacteria STREPTOCOCCUS CONSTELLATUS/MILLERI   Final  URINE CULTURE     Status: None   Collection Time    07/17/13  6:52 PM      Result Value Range Status   Specimen Description URINE, CATHETERIZED   Final   Special Requests NONE   Final   Culture  Setup Time     Final   Value: 07/17/2013 20:30     Performed at [facility] Count     Final   Value: 30,000 COLONIES/ML     Performed at [facility]   Culture     Final   Value: LACTOBACILLUS SPECIES     Note: Standardized susceptibility testing for this organism is not available.     Performed at [facility]   Report Status 07/19/2013 FINAL   Final  CULTURE, BLOOD (ROUTINE X 2)     Status: None   Collection Time    07/17/13  6:55 PM      Result Value Range Status   Specimen Description BLOOD LEFT ANTECUBITAL DRAWN BY RN   Final   Special Requests BOTTLES DRAWN AEROBIC AND ANAEROBIC 8CC EACH   Final   Culture NO  GROWTH 5 DAYS   Final   Report Status 07/22/2013 FINAL   Final  MRSA PCR SCREENING     Status: Abnormal   Collection Time    07/17/13  8:04 PM      Result Value Range Status   MRSA by PCR POSITIVE (*) NEGATIVE Final   Comment:            The GeneXpert MRSA Assay (FDA  approved for NASAL specimens     only), is one component of a     comprehensive MRSA colonization     surveillance program. It is not     intended to diagnose MRSA     infection nor to guide or     monitor treatment for     MRSA infections.     RESULT CALLED TO, READ BACK BY AND VERIFIED WITH:     H. MCLEOD AT 2317 ON 07/17/13 BY S. VANHOORNE  CULTURE, BLOOD (ROUTINE X 2)     Status: None   Collection Time    07/19/13  9:29 AM      Result Value Range Status   Specimen Description BLOOD RIGHT HAND   Final   Special Requests BOTTLES DRAWN AEROBIC ONLY 4CC   Final   Culture NO GROWTH 4 DAYS   Final   Report Status PENDING   Incomplete  CULTURE, BLOOD (ROUTINE X 2)     Status: None   Collection Time    07/19/13  9:29 AM      Result Value Range Status   Specimen Description BLOOD RIGHT ANTECUBITAL   Final   Special Requests BOTTLES DRAWN AEROBIC AND ANAEROBIC 5CC   Final   Culture NO GROWTH 4 DAYS   Final   Report Status PENDING   Incomplete     Studies: No results found.  Scheduled Meds: . amiodarone  200 mg Oral Daily  . antiseptic oral rinse  15 mL Mouth Rinse BID  . apixaban  5 mg Oral BID  . atorvastatin  10 mg Oral q1800  . cefTRIAXone (ROCEPHIN)  IV  1 g Intravenous Q24H  . diltiazem  300 mg Oral Daily  . DULoxetine  30 mg Oral Daily  . metoprolol  75 mg Oral BID  . potassium chloride  40 mEq Oral Daily  . sodium chloride  3 mL Intravenous Q12H  . vancomycin  500 mg Intravenous Q12H   Continuous Infusions: . sodium chloride 500 mL (07/21/13 0536)  . sodium chloride 50 mL/hr at 07/22/13 2041  . diltiazem (CARDIZEM) infusion Stopped (07/24/13 0746)    Principal Problem:   Acute on chronic  diastolic CHF (congestive heart failure), NYHA class 2 Active Problems:   Atrial fibrillation   Atrial thrombus   CVA (cerebral infarction)  Time spent:  Rodert Hinch K  [facility] Pager [phone]. If 7PM-7AM, please contact night-coverage at [email or web address], password [facility] 07/24/2013, 8:21 AM  LOS: 7 days

## 2013-07-25 DIAGNOSIS — K5900 Constipation, unspecified: Secondary | ICD-10-CM

## 2013-07-25 LAB — BASIC METABOLIC PANEL
BUN: 28 mg/dL — ABNORMAL HIGH (ref 6–23)
CO2: 29 mEq/L (ref 19–32)
Creatinine, Ser: 1.06 mg/dL (ref 0.50–1.10)
GFR calc Af Amer: 56 mL/min — ABNORMAL LOW (ref >90)
GFR calc non Af Amer: 49 mL/min — ABNORMAL LOW (ref >90)
Glucose, Bld: 162 mg/dL — ABNORMAL HIGH (ref 70–99)
Potassium: 3.1 mEq/L — ABNORMAL LOW (ref 3.5–5.1)
Sodium: 155 mEq/L — ABNORMAL HIGH (ref 135–145)

## 2013-07-25 LAB — CBC
HCT: 47.5 % — ABNORMAL HIGH (ref 36.0–46.0)
Hemoglobin: 14.7 g/dL (ref 12.0–15.0)
MCV: 97.1 fL (ref 78.0–100.0)
RBC: 4.89 MIL/uL (ref 3.87–5.11)
RDW: 15.8 % — ABNORMAL HIGH (ref 11.5–15.5)
WBC: 15 10*3/uL — ABNORMAL HIGH (ref 4.0–10.5)

## 2013-07-25 MED ORDER — DILTIAZEM HCL 60 MG PO TABS
60.0000 mg | ORAL_TABLET | Freq: Four times a day (QID) | ORAL | Status: DC
  Administered 2013-07-25 – 2013-07-28 (×12): 60 mg via ORAL
  Filled 2013-07-25 (×11): qty 1

## 2013-07-25 MED ORDER — SORBITOL 70 % SOLN
30.0000 mL | Status: AC
  Administered 2013-07-25: 30 mL via ORAL
  Filled 2013-07-25: qty 30

## 2013-07-25 MED ORDER — BISACODYL 10 MG RE SUPP
10.0000 mg | Freq: Once | RECTAL | Status: AC
  Administered 2013-07-25: 10 mg via RECTAL
  Filled 2013-07-25: qty 1

## 2013-07-25 NOTE — Clinical Documentation Improvement (Signed)
Possible Clinical Conditions?   Hypernatremia           Other Condition___________________                 Cannot Clinically Determine_________   Supporting Information:  Risk Factors:  Dehydration  Labs:  Sodium (Normal 135-145) 12/1 = 155 11/30 = 152 11/29 = 152 11/28 = 150 11/27 = 151 11/26 = 154   Treatment:  Monitoring BMP daily   Thank Angelene Giovanni ,RN Clinical Documentation Specialist:  [phone]  [facility]- Health Information Management

## 2013-07-25 NOTE — Progress Notes (Signed)
UR chart review completed.  

## 2013-07-25 NOTE — Clinical Social Work Note (Signed)
CSW provided update to [facility], facility continues to be willing to accept patient at discharge if family does not desire to take patient home at discharge.  Santa Genera, LCSW Clinical Social Worker [phone])

## 2013-07-25 NOTE — Progress Notes (Signed)
Consulting cardiologist:  Subjective:   Laurie Ramirez is a 79 y.o.female with known history of combined CHF, Chronic Renal failure, and Atrial fibrillation, and CVA. She was not placed on anticoagulation due to falls. She was actually sent to [facility] on 07/08/2013 from our office by Dr.McDowell due to decompensated CHF and hypotension. She was diagnosed with MCA  infarct on 07/12/2013 with right sided hemiparesis, likely due to thrombus. She was not on anticoagulation due to frequent falls.  She was diuresed and treated initially with dilitazem gtt, placed on amiodarone, and metoprolol. Plans for discharge on 07/22/2013 but no bed available in SNF.  However, over the weekend,(07/17/2013)  she was again found to have atrial fib with RVR, moved to the ICU. We are asked to see again due to recurrent atrial fib with RVR over the weekend after weaning off of Cardizem gtt and placed on po at 240 mg.  Now back on Cardizem Gtt after bolus. Family now wants to take her home instead of SNF placement. She is not responsive this am, but son at bedside states she ate breakfast and was awake earlier.     Objective:   Temp:  [97.9 F (36.6 C)-99 F (37.2 C)] 98.6 F (37 C) (12/01 0748) Pulse Rate:  [42-164] 91 (12/01 0800) Resp:  [9-33] 9 (12/01 0800) BP: (84-154)/(14-127) 132/60 mmHg (12/01 0800) SpO2:  [92 %-98 %] 96 % (12/01 0800) Weight:  [214 lb 11.7 oz (97.4 kg)] 214 lb 11.7 oz (97.4 kg) (12/01 0500) Last BM Date: 07/10/13  Filed Weights   07/23/13 2040 07/24/13 0500 07/25/13 0500  Weight: 213 lb 3 oz (96.7 kg) 214 lb 1.1 oz (97.1 kg) 214 lb 11.7 oz (97.4 kg)    Intake/Output Summary (Last 24 hours) at 07/25/13 0954 Last data filed at 07/25/13 0800  Gross per 24 hour  Intake 1645.17 ml  Output   1700 ml  Net -54.83 ml    Telemetry: Atrial fib with HR in the 90's and PVC's.   Exam:  General: No acute distress. Opens eyes to verbal response but falls asleep easily  HEENT: Conjunctiva and  lids normal, oropharynx clear.  Lungs: Clear to auscultation, nonlabored. Difficult to assess bases. Remains on O2   Cardiac: No elevated JVP or bruits. IRRR, no gallop or rub.   Abdomen: Normoactive bowel sounds, nontender, nondistended.  Extremities: No pitting edema, distal pulses full.  Neuropsychiatric: Alert and oriented x3, affect appropriate.   Lab Results:  Basic Metabolic Panel:  Recent Labs Lab 07/21/13 0735  07/23/13 0540 07/24/13 0438 07/25/13 0501  NA  --   < > 152* 152* 155*  K  --   < > 3.2* 3.6 3.1*  CL  --   < > 110 113* 117*  CO2  --   < > 31 28 29   GLUCOSE  --   < > 149* 182* 162*  BUN  --   < > 30* 30* 28*  CREATININE  --   < > 1.03 1.05 1.06  CALCIUM  --   < > 9.5 9.3 9.3  MG 1.5  --   --   --   --   < > = values in this interval not displayed.   CBC:  Recent Labs Lab 07/20/13 0547 07/22/13 0543 07/25/13 0501  WBC 17.7* 17.3* 15.0*  HGB 16.4* 15.9* 14.7  HCT 53.4* 50.3* 47.5*  MCV 98.2 96.4 97.1  PLT 247 276 333    BNP:  Recent Labs  06/18/13 1015 06/27/13 1618  PROBNP 11470.0* 2520.0*     Radiology: Dg Abd Portable 1v  07/24/2013   CLINICAL DATA:  Small bowel obstruction. Evaluate for interval change.  EXAM: PORTABLE ABDOMEN - 1 VIEW  COMPARISON:  None.  FINDINGS: Single portable supine view of the abdomen and pelvis. The far right-sided abdomen is excluded. Contrast is seen within normal caliber colon. Relative paucity of small bowel gas, nonspecific. Right hemidiaphragm elevation. No gross free intraperitoneal air or abnormal abdominal calcifications.  IMPRESSION: Paucity of small bowel gas, nonspecific. No specific evidence of bowel obstruction or free intraperitoneal air.  Exclusion of the for right-sided abdomen.   Electronically Signed   By: Jeronimo Greaves M.D.   On: 07/24/2013 20:00   Echocardiogram:  Left ventricle: The cavity size was normal. Wall thickness was increased in a pattern of mild to moderate LVH. Systolic  function was mildly reduced. The estimated ejection fraction was in the range of 45% to 50%. Diffuse hypokinesis. Features are consistent with a pseudonormal left ventricular filling pattern, with concomitant abnormal relaxation and increased filling pressure (grade 2 diastolic dysfunction). Doppler parameters are consistent with high ventricular filling pressure. - Aortic valve: Mildly to moderately calcified annulus. Trileaflet; mildly calcified leaflets. Left coronary cusp mobility was restricted. Trivial regurgitation. Mean gradient: 2mm Hg (S). - Mitral valve: Calcified annulus. Mildly thickened leaflets . Mild regurgitation. - Left atrium: There is a rounded, mobile echodensity seen in the left atrium, appears to be affixed to the atrial septum. Measures approximately 2 x 2 cm. Would be consistent with a Myxoma, however was not visualized on the prior study from August 2014 and therefore more suggestive of a thrombus. The atrium was moderately to severely dilated. - Right ventricle: The cavity size was mildly dilated. Systolic function was moderately reduced. - Right atrium: The atrium was at the upper limits of normal in size. Central venous pressure: 8mm Hg (est). - Tricuspid valve: Mild regurgitation. - Pulmonic valve: Mild regurgitation. - Pulmonary arteries: PA peak pressure: 28mm Hg (S). - Pericardium, extracardiac: There was no pericardial Effusion.  Comparison to prior study August 2014. Mild to moderate LVH with LVEF 45-50%, grade 2 diastolic dysfunction with increased filling pressures. Moderate to severe left atrial enlargement. There is a probable thrombus with the left atrium as described above. Sclerotic aortic valve with trivial aortic regurgitation. Mild RV enlargement with moderately reduced contraction. Mild tricuspid regurgitation PASP 28 mmHg.    Medications:   Scheduled Medications: . amiodarone  200 mg Oral Daily  . antiseptic oral rinse  15 mL  Mouth Rinse BID  . apixaban  5 mg Oral BID  . atorvastatin  10 mg Oral q1800  . bisacodyl  10 mg Rectal Once  . cefTRIAXone (ROCEPHIN)  IV  1 g Intravenous Q24H  . diltiazem  300 mg Oral Daily  . DULoxetine  30 mg Oral Daily  . furosemide  40 mg Oral Daily  . lubiprostone  24 mcg Oral BID WC  . metoprolol  75 mg Oral BID  . potassium chloride  40 mEq Oral Daily  . sodium chloride  3 mL Intravenous Q12H  . sorbitol  30 mL Oral NOW  . vancomycin  500 mg Intravenous Q12H     Infusions: . sodium chloride 500 mL (07/21/13 0536)  . sodium chloride 50 mL/hr at 07/25/13 0800  . diltiazem (CARDIZEM) infusion 5 mg/hr (07/25/13 0800)     PRN Medications:  sodium chloride, acetaminophen, labetalol, ondansetron (ZOFRAN) IV, ondansetron, sodium chloride  Assessment and Plan:   1. Atrial fib with RVR:  Now rate controlled on diltiazem gtt at 5 mg/hr. Was on PO 240 mg daily, and continues on metoprolol 75 mg BID, amiodarone 200 mg daily, and apixaban 5 mg daily. She is able to take PO. Review of BP demonstrates 130'-140's systolic. Metoprolol has been increased from 50 mg BID to 75 mg BID by PTH. Will restart PO Diltiazem 60 mg Q 6 hours and monitor her response.(She was placed on 240 mg po daily last week in anticipation of discharge before HR went back up).  CHADS score of 5 with apxiaban now on board.. Continue other medications. H/H is WNL.   2. Chronic Mixed CHF: EF of 45% with Grade II diastolic dysfunction-She appears compensated at this time. Wt is close to baseline  dry wt of 96 kg, current wt, 97.4 kg. She will remain on lasix 40 mg daily. Creatinine is 1.06 this am.    3.Septicemia: Blood cultures gram +. Continues on Vanc.  4. MCA infarct: Diagnosed by CT on 07/12/2013. Right sided hemiparesis. Thought to be embolic in the setting of atrial fib. Now on apixaban.  Laurie Ramirez. Lyman Bishop NP [facility] 07/25/2013, 9:54 AM  Attending Note Patient seen and discussed with NP  Lyman Bishop, I agree with her documentation above. Afib with RVR required her to go back on cardizem drip, rates now controlled. Will continue to titrate up metoprolol and diltiazem orally to achieve rate control and get her off dilt gtt. Continue amio and elaquis. From a heart failure standpoint she appears to be euvolemic, will continue current diuretic dosing.    Dina Rich MD

## 2013-07-25 NOTE — Progress Notes (Signed)
ANTIBIOTIC CONSULT NOTE  Pharmacy Consult for vancomycin Indication: leukocytosis/?bacteremia   No Known Allergies  Patient Measurements: Height: 5' (152.4 cm) Weight: 214 lb 11.7 oz (97.4 kg) IBW/kg (Calculated) : 45.5 Adjusted Body Weight: 70kg   Vital Signs: Temp: 98.6 F (37 C) (12/01 0748) Temp src: Axillary (12/01 0748) BP: 132/60 mmHg (12/01 0800) Pulse Rate: 91 (12/01 0800) Intake/Output from previous day: 11/30 0701 - 12/01 0700 In: 1509 [I.V.:1359; IV Piggyback:150] Out: 1700 [Urine:1700] Intake/Output from this shift: Total I/O In: 295 [P.O.:240; I.V.:55] Out: -   Labs:  Recent Labs  07/23/13 0540 07/24/13 0438 07/25/13 0501  WBC  --   --  15.0*  HGB  --   --  14.7  PLT  --   --  333  CREATININE 1.03 1.05 1.06   Estimated Creatinine Clearance: 45 ml/min (by C-G formula based on Cr of 1.06).  Recent Labs  07/22/13 1130  VANCOTROUGH 16.5    Microbiology: Recent Results (from the past 720 hour(s))  MRSA PCR SCREENING     Status: None   Collection Time    07/12/13  2:42 AM      Result Value Range Status   MRSA by PCR NEGATIVE  NEGATIVE Final   Comment:            The GeneXpert MRSA Assay (FDA     approved for NASAL specimens     only), is one component of a     comprehensive MRSA colonization     surveillance program. It is not     intended to diagnose MRSA     infection nor to guide or     monitor treatment for     MRSA infections.  CULTURE, BLOOD (ROUTINE X 2)     Status: None   Collection Time    07/17/13  5:54 PM      Result Value Range Status   Specimen Description BLOOD RIGHT ANTECUBITAL   Final   Special Requests BOTTLES DRAWN AEROBIC ONLY [facility]   Final   Culture  Setup Time     Final   Value: 07/18/2013 20:30     Performed at [facility]   Culture     Final   Value: STREPTOCOCCUS CONSTELLATUS/MILLERI     Note: Gram Stain Report Called to,Read Back By and Verified With: Pasty Arch AT 2139 ON [id number] BY FORSYTH K   Performed at [facility]   Report Status 07/23/2013 FINAL   Final   Organism ID, Bacteria STREPTOCOCCUS CONSTELLATUS/MILLERI   Final  URINE CULTURE     Status: None   Collection Time    07/17/13  6:52 PM      Result Value Range Status   Specimen Description URINE, CATHETERIZED   Final   Special Requests NONE   Final   Culture  Setup Time     Final   Value: 07/17/2013 20:30     Performed at [facility] Count     Final   Value: 30,000 COLONIES/ML     Performed at [facility]   Culture     Final   Value: LACTOBACILLUS SPECIES     Note: Standardized susceptibility testing for this organism is not available.     Performed at [facility]   Report Status 07/19/2013 FINAL   Final  CULTURE, BLOOD (ROUTINE X 2)     Status: None   Collection Time    07/17/13  6:55 PM  Result Value Range Status   Specimen Description BLOOD LEFT ANTECUBITAL DRAWN BY RN   Final   Special Requests BOTTLES DRAWN AEROBIC AND ANAEROBIC 8CC EACH   Final   Culture NO GROWTH 5 DAYS   Final   Report Status 07/22/2013 FINAL   Final  MRSA PCR SCREENING     Status: Abnormal   Collection Time    07/17/13  8:04 PM      Result Value Range Status   MRSA by PCR POSITIVE (*) NEGATIVE Final   Comment:            The GeneXpert MRSA Assay (FDA     approved for NASAL specimens     only), is one component of a     comprehensive MRSA colonization     surveillance program. It is not     intended to diagnose MRSA     infection nor to guide or     monitor treatment for     MRSA infections.     RESULT CALLED TO, READ BACK BY AND VERIFIED WITH:     H. MCLEOD AT 2317 ON 07/17/13 BY S. VANHOORNE  CULTURE, BLOOD (ROUTINE X 2)     Status: None   Collection Time    07/19/13  9:29 AM      Result Value Range Status   Specimen Description BLOOD RIGHT HAND   Final   Special Requests BOTTLES DRAWN AEROBIC ONLY 4CC   Final   Culture NO GROWTH 5 DAYS   Final   Report Status 07/24/2013  FINAL   Final  CULTURE, BLOOD (ROUTINE X 2)     Status: None   Collection Time    07/19/13  9:29 AM      Result Value Range Status   Specimen Description BLOOD RIGHT ANTECUBITAL   Final   Special Requests BOTTLES DRAWN AEROBIC AND ANAEROBIC 5CC   Final   Culture NO GROWTH 5 DAYS   Final   Report Status 07/24/2013 FINAL   Final   Medical History: Past Medical History  Diagnosis Date  . Essential hypertension, benign   . Atrial fibrillation   . Bronchitis   . Depression   . Chronic combined systolic and diastolic heart failure      LVEF 45-50%  . 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 Chronic systolic heart failure 06/18/2013   Medications:  Scheduled:  . amiodarone  200 mg Oral Daily  . antiseptic oral rinse  15 mL Mouth Rinse BID  . apixaban  5 mg Oral BID  . atorvastatin  10 mg Oral q1800  . bisacodyl  10 mg Rectal Once  . cefTRIAXone (ROCEPHIN)  IV  1 g Intravenous Q24H  . diltiazem  300 mg Oral Daily  . DULoxetine  30 mg Oral Daily  . furosemide  40 mg Oral Daily  . lubiprostone  24 mcg Oral BID WC  . metoprolol  75 mg Oral BID  . potassium chloride  40 mEq Oral Daily  . sodium chloride  3 mL Intravenous Q12H  . sorbitol  30 mL Oral NOW  . vancomycin  500 mg Intravenous Q12H   Infusions:  . sodium chloride 500 mL (07/21/13 0536)  . sodium chloride 50 mL/hr at 07/25/13 0800  . diltiazem (CARDIZEM) infusion 5 mg/hr (07/25/13 0800)   PRN: sodium chloride, acetaminophen, labetalol, ondansetron (ZOFRAN) IV, ondansetron, sodium chloride  Assessment: 79yo female with + streptococci in 1/2 blood culture.  Repeat blood cx have show NGTD.   She  was initially started on Rocephin for leukocytosis and Vancomycin added for +blood cx.  WBC remains elevated & essentially unchanged since admission.   Vancomycin trough level is within goal range when checked.  Renal function is currently stable.  Goal of Therapy:  Vancomycin trough level 15-20 mcg/ml.    Plan:  1. Continue Vancomycin  500mg  IV q12h 2. Check weekly trough level as indicated 3. Monitor renal function and cx data   Valrie Hart A 07/25/2013,10:17 AM

## 2013-07-25 NOTE — Progress Notes (Signed)
[facility] PROGRESS NOTE  Laurie Ramirez XBM:[id number] DOB: 10-Feb-1932 DOA: 07/17/2013 PCP: Colette Ribas, MD  Assessment/Plan: 1. Atrial fibrillation with rapid ventricular response. May have been precipitated by acute decompensated CHF. Initially placed on a Cardizem drip then weaned to off with oral Cardizem at 240 mg by mouth daily. Continued amiodarone 200 mg daily and metoprolol 50 mg twice a day. HR had initially been rate controlled. However, pt developed RVR once again, failed IV beta blocker and IV cardizem boluses. Pt has since been continued on cardizem gtt once again. Cardiology now consulted. Await recs. 2. Acute on chronic combined systolic and diastolic congestive heart failure. Transthoracic echocardiogram performed on 07/11/2013 which showed an ejection fraction of 45-50%, diffuse hypokinesis with grade 2 diastolic dysfunction. Initially transitioned to BID PO lasix. Net -9L since admit. Renal function began to decline so diuretics were held. Pt's dry weight is 96kg. Would resume qd lasix. Will ask Cardiology with assistance regarding chf. 3. Leukocytosis. Patient's white count at around 18k. Unclear source, one out of two blood cultures growing gram-positive cocci in chains from 11/23 for which repeat blood cultures are neg x2 thus far, suspect contamination. Organism identification and susceptibility testing in progress. WBC now trending down. Consider d/c'ing abx. 4. Hypertension. Blood pressure stable, continue Cardizem 240 mg by mouth daily and metoprolol 50 mg by mouth twice a day. 5. Dyslipidemia. Continue statin therapy.  6. Hypokalemia. Correct as needed 7. History of CVA. Recent discharge from [facility], CT scan performed on 07/12/2013 showing large region of hypodensity in the left MCA distribution compatible with MCA infarct. Likely secondary to thromboembolic phenomenon from atrial fibrillation. Continue statin and Eliquis.  8. Constipation 1. Failed enema  likely secondary to hx of CVA 2. Will give trial of sorbitol plus dulcolax suppository  Code Status: Full Family Communication: Pt in room (indicate person spoken with, relationship, and if by phone, the number) Disposition Plan: Pending placement  Antibiotics:  Vancomycin 07/18/13>>>07/25/13  Rocephin 07/18/13>>>07/25/13  HPI/Subjective: Required cardizem gtt yesterday. No other acute events noted  Objective: Filed Vitals:   07/25/13 0645 07/25/13 0700 07/25/13 0748 07/25/13 0800  BP: 131/44   132/60  Pulse: 99 80  91  Temp:   98.6 F (37 C)   TempSrc:   Axillary   Resp: 25 31  9   Height:      Weight:      SpO2: 93% 94%  96%    Intake/Output Summary (Last 24 hours) at 07/25/13 0904 Last data filed at 07/25/13 0800  Gross per 24 hour  Intake 1645.17 ml  Output   1700 ml  Net -54.83 ml   Filed Weights   07/23/13 2040 07/24/13 0500 07/25/13 0500  Weight: 96.7 kg (213 lb 3 oz) 97.1 kg (214 lb 1.1 oz) 97.4 kg (214 lb 11.7 oz)    Exam:   General:  Awake, in nad  Cardiovascular: irregularly, irregular  Respiratory: normal resp effort, no wheezing  Abdomen: soft, nondistended  Musculoskeletal: perfused, no clubbing   Data Reviewed: Basic Metabolic Panel:  Recent Labs Lab 07/21/13 0628 07/21/13 0735 07/22/13 0543 07/23/13 0540 07/24/13 0438 07/25/13 0501  NA 151*  --  150* 152* 152* 155*  K 3.0*  --  3.1* 3.2* 3.6 3.1*  CL 107  --  106 110 113* 117*  CO2 34*  --  31 31 28 29   GLUCOSE 176*  --  170* 149* 182* 162*  BUN 21  --  30* 30* 30* 28*  CREATININE 0.80  --  1.14* 1.03 1.05 1.06  CALCIUM 9.8  --  9.5 9.5 9.3 9.3  MG  --  1.5  --   --   --   --    Liver Function Tests: No results found for this basename: AST, ALT, ALKPHOS, BILITOT, PROT, ALBUMIN,  in the last 168 hours No results found for this basename: LIPASE, AMYLASE,  in the last 168 hours No results found for this basename: AMMONIA,  in the last 168 hours CBC:  Recent Labs Lab  07/19/13 0505 07/20/13 0547 07/22/13 0543 07/25/13 0501  WBC 18.3* 17.7* 17.3* 15.0*  HGB 16.8* 16.4* 15.9* 14.7  HCT 52.9* 53.4* 50.3* 47.5*  MCV 96.7 98.2 96.4 97.1  PLT 226 247 276 333   Cardiac Enzymes: No results found for this basename: CKTOTAL, CKMB, CKMBINDEX, TROPONINI,  in the last 168 hours BNP (last 3 results)  Recent Labs  06/18/13 1015 06/27/13 1618  PROBNP 11470.0* 2520.0*   CBG:  Recent Labs Lab 07/21/13 1658  GLUCAP 149*    Recent Results (from the past 240 hour(s))  CULTURE, BLOOD (ROUTINE X 2)     Status: None   Collection Time    07/17/13  5:54 PM      Result Value Range Status   Specimen Description BLOOD RIGHT ANTECUBITAL   Final   Special Requests BOTTLES DRAWN AEROBIC ONLY [facility]   Final   Culture  Setup Time     Final   Value: 07/18/2013 20:30     Performed at [facility]   Culture     Final   Value: STREPTOCOCCUS CONSTELLATUS/MILLERI     Note: Gram Stain Report Called to,Read Back By and Verified With: Pasty Arch AT 2139 ON [id number] BY FORSYTH K     Performed at [facility]   Report Status 07/23/2013 FINAL   Final   Organism ID, Bacteria STREPTOCOCCUS CONSTELLATUS/MILLERI   Final  URINE CULTURE     Status: None   Collection Time    07/17/13  6:52 PM      Result Value Range Status   Specimen Description URINE, CATHETERIZED   Final   Special Requests NONE   Final   Culture  Setup Time     Final   Value: 07/17/2013 20:30     Performed at [facility] Count     Final   Value: 30,000 COLONIES/ML     Performed at [facility]   Culture     Final   Value: LACTOBACILLUS SPECIES     Note: Standardized susceptibility testing for this organism is not available.     Performed at [facility]   Report Status 07/19/2013 FINAL   Final  CULTURE, BLOOD (ROUTINE X 2)     Status: None   Collection Time    07/17/13  6:55 PM      Result Value Range Status   Specimen Description BLOOD LEFT ANTECUBITAL  DRAWN BY RN   Final   Special Requests BOTTLES DRAWN AEROBIC AND ANAEROBIC 8CC EACH   Final   Culture NO GROWTH 5 DAYS   Final   Report Status 07/22/2013 FINAL   Final  MRSA PCR SCREENING     Status: Abnormal   Collection Time    07/17/13  8:04 PM      Result Value Range Status   MRSA by PCR POSITIVE (*) NEGATIVE Final   Comment:  The GeneXpert MRSA Assay (FDA     approved for NASAL specimens     only), is one component of a     comprehensive MRSA colonization     surveillance program. It is not     intended to diagnose MRSA     infection nor to guide or     monitor treatment for     MRSA infections.     RESULT CALLED TO, READ BACK BY AND VERIFIED WITH:     H. MCLEOD AT 2317 ON 07/17/13 BY S. VANHOORNE  CULTURE, BLOOD (ROUTINE X 2)     Status: None   Collection Time    07/19/13  9:29 AM      Result Value Range Status   Specimen Description BLOOD RIGHT HAND   Final   Special Requests BOTTLES DRAWN AEROBIC ONLY 4CC   Final   Culture NO GROWTH 5 DAYS   Final   Report Status 07/24/2013 FINAL   Final  CULTURE, BLOOD (ROUTINE X 2)     Status: None   Collection Time    07/19/13  9:29 AM      Result Value Range Status   Specimen Description BLOOD RIGHT ANTECUBITAL   Final   Special Requests BOTTLES DRAWN AEROBIC AND ANAEROBIC 5CC   Final   Culture NO GROWTH 5 DAYS   Final   Report Status 07/24/2013 FINAL   Final     Studies: Dg Abd Portable 1v  07/24/2013   CLINICAL DATA:  Small bowel obstruction. Evaluate for interval change.  EXAM: PORTABLE ABDOMEN - 1 VIEW  COMPARISON:  None.  FINDINGS: Single portable supine view of the abdomen and pelvis. The far right-sided abdomen is excluded. Contrast is seen within normal caliber colon. Relative paucity of small bowel gas, nonspecific. Right hemidiaphragm elevation. No gross free intraperitoneal air or abnormal abdominal calcifications.  IMPRESSION: Paucity of small bowel gas, nonspecific. No specific evidence of bowel obstruction  or free intraperitoneal air.  Exclusion of the for right-sided abdomen.   Electronically Signed   By: Jeronimo Greaves M.D.   On: 07/24/2013 20:00    Scheduled Meds: . amiodarone  200 mg Oral Daily  . antiseptic oral rinse  15 mL Mouth Rinse BID  . apixaban  5 mg Oral BID  . atorvastatin  10 mg Oral q1800  . cefTRIAXone (ROCEPHIN)  IV  1 g Intravenous Q24H  . diltiazem  300 mg Oral Daily  . DULoxetine  30 mg Oral Daily  . furosemide  40 mg Oral Daily  . lubiprostone  24 mcg Oral BID WC  . metoprolol  75 mg Oral BID  . potassium chloride  40 mEq Oral Daily  . sodium chloride  3 mL Intravenous Q12H  . sorbitol  30 mL Oral NOW  . vancomycin  500 mg Intravenous Q12H   Continuous Infusions: . sodium chloride 500 mL (07/21/13 0536)  . sodium chloride 50 mL/hr at 07/25/13 0800  . diltiazem (CARDIZEM) infusion 5 mg/hr (07/25/13 0800)    Principal Problem:   Acute on chronic diastolic CHF (congestive heart failure), NYHA class 2 Active Problems:   Atrial fibrillation   Atrial thrombus   CVA (cerebral infarction)  Time spent:  Dorea Duff K  [facility] Pager [phone]. If 7PM-7AM, please contact night-coverage at [email or web address], password [facility] 07/25/2013, 9:04 AM  LOS: 8 days

## 2013-07-26 DIAGNOSIS — I5022 Chronic systolic (congestive) heart failure: Secondary | ICD-10-CM

## 2013-07-26 DIAGNOSIS — E870 Hyperosmolality and hypernatremia: Secondary | ICD-10-CM

## 2013-07-26 LAB — BASIC METABOLIC PANEL
BUN: 26 mg/dL — ABNORMAL HIGH (ref 6–23)
BUN: 25 mg/dL — ABNORMAL HIGH (ref 6–23)
CO2: 28 mEq/L (ref 19–32)
CO2: 29 mEq/L (ref 19–32)
Calcium: 9.5 mg/dL (ref 8.4–10.5)
Chloride: 117 mEq/L — ABNORMAL HIGH (ref 96–112)
Chloride: 117 mEq/L — ABNORMAL HIGH (ref 96–112)
Creatinine, Ser: 0.99 mg/dL (ref 0.50–1.10)
Creatinine, Ser: 1.01 mg/dL (ref 0.50–1.10)
Glucose, Bld: 143 mg/dL — ABNORMAL HIGH (ref 70–99)
Glucose, Bld: 254 mg/dL — ABNORMAL HIGH (ref 70–99)
Potassium: 2.9 mEq/L — ABNORMAL LOW (ref 3.5–5.1)
Potassium: 3.1 mEq/L — ABNORMAL LOW (ref 3.5–5.1)
Sodium: 156 mEq/L — ABNORMAL HIGH (ref 135–145)
Sodium: 156 mEq/L — ABNORMAL HIGH (ref 135–145)

## 2013-07-26 MED ORDER — BISACODYL 10 MG RE SUPP: 10.0000 mg | Freq: Every day | RECTAL | Status: DC | PRN

## 2013-07-26 MED ORDER — DEXTROSE 5 % IV SOLN
INTRAVENOUS | Status: AC
  Administered 2013-07-26: 13:00:00 via INTRAVENOUS

## 2013-07-26 MED ORDER — POTASSIUM CHLORIDE 20 MEQ PO PACK
40.0000 meq | PACK | Freq: Once | ORAL | Status: AC
  Administered 2013-07-26: 40 meq via ORAL
  Filled 2013-07-26: qty 2

## 2013-07-26 NOTE — Progress Notes (Signed)
Consulting cardiologist: Dr. Dina Rich  Subjective:    Patient awakens to voice. No verbal communication. Does not appear to be in any distress.  Objective:   Temp:  [97 F (36.1 C)-98.7 F (37.1 C)] 97 F (36.1 C) (12/02 0730) Pulse Rate:  [45-111] 83 (12/02 0100) Resp:  [10-28] 23 (12/02 0100) BP: (104-142)/(43-73) 142/65 mmHg (12/02 0548) SpO2:  [92 %-99 %] 92 % (12/02 0100) Weight:  [220 lb 3.2 oz (99.882 kg)] 220 lb 3.2 oz (99.882 kg) (12/02 0500) Last BM Date: 07/25/13  Filed Weights   07/24/13 0500 07/25/13 0500 07/26/13 0500  Weight: 214 lb 1.1 oz (97.1 kg) 214 lb 11.7 oz (97.4 kg) 220 lb 3.2 oz (99.882 kg)    Intake/Output Summary (Last 24 hours) at 07/26/13 1130 Last data filed at 07/26/13 0449  Gross per 24 hour  Intake 1400.83 ml  Output    800 ml  Net 600.83 ml    Telemetry: Atrial fibrillation with rates in the 90's.  Exam:  General: No obvious distress.  Lungs: Clear to auscultation, nonlabored.  Cardiac: No elevated JVP or bruits. Irregularly irregular.   Abdomen: Normoactive bowel sounds, nontender, nondistended.  Extremities: No pitting edema, distal pulses full.   Lab Results:  Basic Metabolic Panel:  Recent Labs Lab 07/21/13 0735  07/24/13 0438 07/25/13 0501 07/26/13 0521  NA  --   < > 152* 155* 156*  K  --   < > 3.6 3.1* 2.9*  CL  --   < > 113* 117* 117*  CO2  --   < > 28 29 28   GLUCOSE  --   < > 182* 162* 143*  BUN  --   < > 30* 28* 26*  CREATININE  --   < > 1.05 1.06 0.99  CALCIUM  --   < > 9.3 9.3 9.2  MG 1.5  --   --   --   --   < > = values in this interval not displayed.  CBC:  Recent Labs Lab 07/20/13 0547 07/22/13 0543 07/25/13 0501  WBC 17.7* 17.3* 15.0*  HGB 16.4* 15.9* 14.7  HCT 53.4* 50.3* 47.5*  MCV 98.2 96.4 97.1  PLT 247 276 333     Radiology: Dg Abd Portable 1v  07/24/2013   CLINICAL DATA:  Small bowel obstruction. Evaluate for interval change.  EXAM: PORTABLE ABDOMEN - 1 VIEW   COMPARISON:  None.  FINDINGS: Single portable supine view of the abdomen and pelvis. The far right-sided abdomen is excluded. Contrast is seen within normal caliber colon. Relative paucity of small bowel gas, nonspecific. Right hemidiaphragm elevation. No gross free intraperitoneal air or abnormal abdominal calcifications.  IMPRESSION: Paucity of small bowel gas, nonspecific. No specific evidence of bowel obstruction or free intraperitoneal air.  Exclusion of the for right-sided abdomen.   Electronically Signed   By: Jeronimo Greaves M.D.   On: 07/24/2013 20:00     Medications:   Scheduled Medications: . amiodarone  200 mg Oral Daily  . antiseptic oral rinse  15 mL Mouth Rinse BID  . apixaban  5 mg Oral BID  . atorvastatin  10 mg Oral q1800  . cefTRIAXone (ROCEPHIN)  IV  1 g Intravenous Q24H  . diltiazem  60 mg Oral Q6H  . DULoxetine  30 mg Oral Daily  . furosemide  40 mg Oral Daily  . lubiprostone  24 mcg Oral BID WC  . metoprolol  75 mg Oral BID  . potassium chloride  40 mEq Oral Daily  . sodium chloride  3 mL Intravenous Q12H  . vancomycin  500 mg Intravenous Q12H    Infusions: . sodium chloride 500 mL (07/21/13 0536)  . dextrose      PRN Medications: sodium chloride, acetaminophen, bisacodyl, labetalol, ondansetron (ZOFRAN) IV, ondansetron, sodium chloride   Assessment and Plan:   1. Atrial fibrillation with RVR: HR now better controlled. Transitioned to PO cardizem 60 mg Q 6 hours, (had been on 240 mg just prior to event.), metoprolol 75 mg daily, and continued on amiodarone. BP is stable. SHe also continues on apixaban.  2. Chronic Mixed CHF: LVEF 45% with Grade 2 diastolic dysfunction. Looks to be reasonably compensated. She has been diuresed on IV lasix but is now transitioned to PO.  Creatinine is 0.99.  3. Hypernatremia:  Sodium is still elevated (156)  Patient needs free water supplementation.  Bettey Mare. Lyman Bishop NP [facility] 07/26/2013, 11:30  AM   Attending note:  Patient seen and examined. Modified above note by Ms. Lawrence NP  Reviewed recent records and consultation note from yesterday. Transitioning back to oral Cardizem, dose may need to be further advanced. Could go to 90 mg by mouth every 6 hours if needed, and blood pressure tolerates. She continues on apixaban. She should be able to tolerate additional free water supplementation (hypernatremia) from a CHF perspective. Will continue on oral Lasix for now, dose may need to be decreased ultimately. Followup BMET in AM.   Jonelle Sidle, M.D., F.A.C.C.

## 2013-07-26 NOTE — Progress Notes (Signed)
[facility] PROGRESS NOTE  Laurie Ramirez ZOX:[id number] DOB: October 30, 1931 DOA: 07/17/2013 PCP: Laurie Ribas, MD  Assessment/Plan: 1. Atrial fibrillation with rapid ventricular response. May have been precipitated by acute decompensated CHF. Initially placed on a Cardizem drip then weaned to off with oral Cardizem at 240 mg by mouth daily. Continued amiodarone 200 mg daily and metoprolol 50 mg twice a day. HR had initially been rate controlled. However, pt developed RVR once again, failed IV beta blocker and IV cardizem boluses. Pt has since been continued on cardizem gtt once again. Appreciate Cardiology recs. 2. Acute on chronic combined systolic and diastolic congestive heart failure. Transthoracic echocardiogram performed on 07/11/2013 which showed an ejection fraction of 45-50%, diffuse hypokinesis with grade 2 diastolic dysfunction. Initially transitioned to BID PO lasix. Net -8L since admit. Renal function began to decline so diuretics were held. Pt's dry weight is 96kg. Have resumed qd lasix.  3. Leukocytosis. Patient's white count at around 18k. Unclear source, one out of two blood cultures growing gram-positive cocci in chains from 11/23 for which repeat blood cultures are neg x2 thus far, suspect contamination.  WBC now trending down. Consider d/c'ing abx. 4. Hypertension. Blood pressure stable, continue Cardizem 240 mg by mouth daily and metoprolol 50 mg by mouth twice a day. 5. Dyslipidemia. Continue statin therapy.  6. Hypokalemia. Correct as needed 7. History of CVA. Recent discharge from [facility], CT scan performed on 07/12/2013 showing large region of hypodensity in the left MCA distribution compatible with MCA infarct. Likely secondary to thromboembolic phenomenon from atrial fibrillation. Continue statin and Eliquis.  8. Constipation 1. Failed enema likely secondary to hx of CVA 2. Cont bowel regimen as needed 9. Hypernatremia 1. Slowly trending up 2. Discussed with  Cardiology, OK to start free water 3. Will start on D5W and follow sodium levels 4. Continue to monitor volume status in the setting of CHF  Code Status: Full Family Communication: Pt in room (indicate person spoken with, relationship, and if by phone, the number) Disposition Plan: Pending placement  Antibiotics:  Vancomycin 07/18/13>>>07/25/13  Rocephin 07/18/13>>>07/25/13  HPI/Subjective: No acute events noted overnight  Objective: Filed Vitals:   07/26/13 0100 07/26/13 0449 07/26/13 0500 07/26/13 0548  BP: 115/56   142/65  Pulse: 83     Temp:  97.1 F (36.2 C)    TempSrc:  Axillary    Resp: 23     Height:      Weight:   99.882 kg (220 lb 3.2 oz)   SpO2: 92%       Intake/Output Summary (Last 24 hours) at 07/26/13 0832 Last data filed at 07/26/13 0449  Gross per 24 hour  Intake 1665.83 ml  Output    800 ml  Net 865.83 ml   Filed Weights   07/24/13 0500 07/25/13 0500 07/26/13 0500  Weight: 97.1 kg (214 lb 1.1 oz) 97.4 kg (214 lb 11.7 oz) 99.882 kg (220 lb 3.2 oz)    Exam:   General:  Awake, in nad  Cardiovascular: irregularly, irregular  Respiratory: normal resp effort, no wheezing  Abdomen: soft, nondistended  Musculoskeletal: perfused, no clubbing   Data Reviewed: Basic Metabolic Panel:  Recent Labs Lab 07/21/13 0735 07/22/13 0543 07/23/13 0540 07/24/13 0438 07/25/13 0501 07/26/13 0521  NA  --  150* 152* 152* 155* 156*  K  --  3.1* 3.2* 3.6 3.1* 2.9*  CL  --  106 110 113* 117* 117*  CO2  --  31 31 28 29  28  GLUCOSE  --  170* 149* 182* 162* 143*  BUN  --  30* 30* 30* 28* 26*  CREATININE  --  1.14* 1.03 1.05 1.06 0.99  CALCIUM  --  9.5 9.5 9.3 9.3 9.2  MG 1.5  --   --   --   --   --    Liver Function Tests: No results found for this basename: AST, ALT, ALKPHOS, BILITOT, PROT, ALBUMIN,  in the last 168 hours No results found for this basename: LIPASE, AMYLASE,  in the last 168 hours No results found for this basename: AMMONIA,  in the last  168 hours CBC:  Recent Labs Lab 07/20/13 0547 07/22/13 0543 07/25/13 0501  WBC 17.7* 17.3* 15.0*  HGB 16.4* 15.9* 14.7  HCT 53.4* 50.3* 47.5*  MCV 98.2 96.4 97.1  PLT 247 276 333   Cardiac Enzymes: No results found for this basename: CKTOTAL, CKMB, CKMBINDEX, TROPONINI,  in the last 168 hours BNP (last 3 results)  Recent Labs  06/18/13 1015 06/27/13 1618  PROBNP 11470.0* 2520.0*   CBG:  Recent Labs Lab 07/21/13 1658  GLUCAP 149*    Recent Results (from the past 240 hour(s))  CULTURE, BLOOD (ROUTINE X 2)     Status: None   Collection Time    07/17/13  5:54 PM      Result Value Range Status   Specimen Description BLOOD RIGHT ANTECUBITAL   Final   Special Requests BOTTLES DRAWN AEROBIC ONLY [facility]   Final   Culture  Setup Time     Final   Value: 07/18/2013 20:30     Performed at [facility]   Culture     Final   Value: STREPTOCOCCUS CONSTELLATUS/MILLERI     Note: Gram Stain Report Called to,Read Back By and Verified With: Pasty Arch AT 2139 ON [id number] BY FORSYTH K     Performed at [facility]   Report Status 07/23/2013 FINAL   Final   Organism ID, Bacteria STREPTOCOCCUS CONSTELLATUS/MILLERI   Final  URINE CULTURE     Status: None   Collection Time    07/17/13  6:52 PM      Result Value Range Status   Specimen Description URINE, CATHETERIZED   Final   Special Requests NONE   Final   Culture  Setup Time     Final   Value: 07/17/2013 20:30     Performed at [facility] Count     Final   Value: 30,000 COLONIES/ML     Performed at [facility]   Culture     Final   Value: LACTOBACILLUS SPECIES     Note: Standardized susceptibility testing for this organism is not available.     Performed at [facility]   Report Status 07/19/2013 FINAL   Final  CULTURE, BLOOD (ROUTINE X 2)     Status: None   Collection Time    07/17/13  6:55 PM      Result Value Range Status   Specimen Description BLOOD LEFT ANTECUBITAL DRAWN BY  RN   Final   Special Requests BOTTLES DRAWN AEROBIC AND ANAEROBIC 8CC EACH   Final   Culture NO GROWTH 5 DAYS   Final   Report Status 07/22/2013 FINAL   Final  MRSA PCR SCREENING     Status: Abnormal   Collection Time    07/17/13  8:04 PM      Result Value Range Status   MRSA by PCR POSITIVE (*)  NEGATIVE Final   Comment:            The GeneXpert MRSA Assay (FDA     approved for NASAL specimens     only), is one component of a     comprehensive MRSA colonization     surveillance program. It is not     intended to diagnose MRSA     infection nor to guide or     monitor treatment for     MRSA infections.     RESULT CALLED TO, READ BACK BY AND VERIFIED WITH:     H. MCLEOD AT 2317 ON 07/17/13 BY S. VANHOORNE  CULTURE, BLOOD (ROUTINE X 2)     Status: None   Collection Time    07/19/13  9:29 AM      Result Value Range Status   Specimen Description BLOOD RIGHT HAND   Final   Special Requests BOTTLES DRAWN AEROBIC ONLY 4CC   Final   Culture NO GROWTH 5 DAYS   Final   Report Status 07/24/2013 FINAL   Final  CULTURE, BLOOD (ROUTINE X 2)     Status: None   Collection Time    07/19/13  9:29 AM      Result Value Range Status   Specimen Description BLOOD RIGHT ANTECUBITAL   Final   Special Requests BOTTLES DRAWN AEROBIC AND ANAEROBIC 5CC   Final   Culture NO GROWTH 5 DAYS   Final   Report Status 07/24/2013 FINAL   Final     Studies: Dg Abd Portable 1v  07/24/2013   CLINICAL DATA:  Small bowel obstruction. Evaluate for interval change.  EXAM: PORTABLE ABDOMEN - 1 VIEW  COMPARISON:  None.  FINDINGS: Single portable supine view of the abdomen and pelvis. The far right-sided abdomen is excluded. Contrast is seen within normal caliber colon. Relative paucity of small bowel gas, nonspecific. Right hemidiaphragm elevation. No gross free intraperitoneal air or abnormal abdominal calcifications.  IMPRESSION: Paucity of small bowel gas, nonspecific. No specific evidence of bowel obstruction or free  intraperitoneal air.  Exclusion of the for right-sided abdomen.   Electronically Signed   By: Jeronimo Greaves M.D.   On: 07/24/2013 20:00    Scheduled Meds: . amiodarone  200 mg Oral Daily  . antiseptic oral rinse  15 mL Mouth Rinse BID  . apixaban  5 mg Oral BID  . atorvastatin  10 mg Oral q1800  . cefTRIAXone (ROCEPHIN)  IV  1 g Intravenous Q24H  . diltiazem  60 mg Oral Q6H  . DULoxetine  30 mg Oral Daily  . furosemide  40 mg Oral Daily  . lubiprostone  24 mcg Oral BID WC  . metoprolol  75 mg Oral BID  . potassium chloride  40 mEq Oral Daily  . sodium chloride  3 mL Intravenous Q12H  . vancomycin  500 mg Intravenous Q12H   Continuous Infusions: . sodium chloride 500 mL (07/21/13 0536)  . sodium chloride 50 mL/hr at 07/26/13 0449    Principal Problem:   Acute on chronic diastolic CHF (congestive heart failure), NYHA class 2 Active Problems:   Atrial fibrillation   Atrial thrombus   CVA (cerebral infarction)  Time spent:  Mizraim Harmening K  [facility] Pager [phone]. If 7PM-7AM, please contact night-coverage at [email or web address], password [facility] 07/26/2013, 8:32 AM  LOS: 9 days

## 2013-07-26 NOTE — Clinical Social Work Note (Signed)
Per MD and RN CM, family plans to take patient home at discharge.  CSW will sign off at present, but will stand by to be reconsulted if plans change and SNF is needed at discharge.  Santa Genera, LCSW Clinical Social Worker [phone])

## 2013-07-27 DIAGNOSIS — E860 Dehydration: Secondary | ICD-10-CM

## 2013-07-27 LAB — BASIC METABOLIC PANEL
BUN: 23 mg/dL (ref 6–23)
CO2: 28 mEq/L (ref 19–32)
Calcium: 8.8 mg/dL (ref 8.4–10.5)
Creatinine, Ser: 0.96 mg/dL (ref 0.50–1.10)
GFR calc Af Amer: 64 mL/min — ABNORMAL LOW (ref >90)
GFR calc non Af Amer: 55 mL/min — ABNORMAL LOW (ref >90)
Glucose, Bld: 178 mg/dL — ABNORMAL HIGH (ref 70–99)

## 2013-07-27 MED ORDER — POTASSIUM CHLORIDE 10 MEQ/100ML IV SOLN
10.0000 meq | INTRAVENOUS | Status: AC
  Administered 2013-07-27 (×4): 10 meq via INTRAVENOUS
  Filled 2013-07-27 (×4): qty 100

## 2013-07-27 MED ORDER — POTASSIUM CHLORIDE 20 MEQ/15ML (10%) PO LIQD
40.0000 meq | ORAL | Status: AC
  Administered 2013-07-27: 40 meq via ORAL
  Filled 2013-07-27: qty 30

## 2013-07-27 NOTE — Progress Notes (Addendum)
Pt ate about 35% of her lunch. However she did not eat any breakfast or lunch. The only time pt eats is with meds, and even then it is a small amount of applesauce. Even with encouragement from family pt still has a poor appetite.

## 2013-07-27 NOTE — Progress Notes (Signed)
Subjective:   No issues overnight   Objective:   Temp:  [97.9 F (36.6 C)-99 F (37.2 C)] 98.9 F (37.2 C) (12/03 0524) Pulse Rate:  [71-109] 77 (12/03 0500) Resp:  [13-44] 24 (12/03 0500) BP: (122-153)/(38-75) 132/38 mmHg (12/03 0500) SpO2:  [92 %-97 %] 94 % (12/03 0500) Weight:  [221 lb 12.8 oz (100.608 kg)] 221 lb 12.8 oz (100.608 kg) (12/03 0524) Last BM Date: 07/26/13  Filed Weights   07/25/13 0500 07/26/13 0500 07/27/13 0524  Weight: 214 lb 11.7 oz (97.4 kg) 220 lb 3.2 oz (99.882 kg) 221 lb 12.8 oz (100.608 kg)    Intake/Output Summary (Last 24 hours) at 07/27/13 0836 Last data filed at 07/27/13 0525  Gross per 24 hour  Intake 1542.5 ml  Output   1775 ml  Net -232.5 ml    Telemetry: afib rates 70s-90s  Exam:  General: Resting comfortably  Resp: CTAB  Cardiac: irreg, no m/r/g, no JVD  GI: soft, NT, ND  MSK: extremities warm, no edema   Lab Results:  Basic Metabolic Panel:  Recent Labs Lab 07/21/13 0735  07/26/13 0521 07/26/13 1540 07/27/13 0440  NA  --   < > 156* 156* 151*  K  --   < > 2.9* 3.1* 2.8*  CL  --   < > 117* 117* 113*  CO2  --   < > 28 29 28   GLUCOSE  --   < > 143* 254* 178*  BUN  --   < > 26* 25* 23  CREATININE  --   < > 0.99 1.01 0.96  CALCIUM  --   < > 9.2 9.5 8.8  MG 1.5  --   --   --   --   < > = values in this interval not displayed.  Liver Function Tests: No results found for this basename: AST, ALT, ALKPHOS, BILITOT, PROT, ALBUMIN,  in the last 168 hours  CBC:  Recent Labs Lab 07/22/13 0543 07/25/13 0501  WBC 17.3* 15.0*  HGB 15.9* 14.7  HCT 50.3* 47.5*  MCV 96.4 97.1  PLT 276 333    Cardiac Enzymes: No results found for this basename: CKTOTAL, CKMB, CKMBINDEX, TROPONINI,  in the last 168 hours  BNP:  Recent Labs  06/18/13 1015 06/27/13 1618  PROBNP 11470.0* 2520.0*    Coagulation: No results found for this basename: INR,  in the last 168 hours    Medications:   Scheduled Medications: .  amiodarone  200 mg Oral Daily  . antiseptic oral rinse  15 mL Mouth Rinse BID  . apixaban  5 mg Oral BID  . atorvastatin  10 mg Oral q1800  . cefTRIAXone (ROCEPHIN)  IV  1 g Intravenous Q24H  . diltiazem  60 mg Oral Q6H  . DULoxetine  30 mg Oral Daily  . furosemide  40 mg Oral Daily  . lubiprostone  24 mcg Oral BID WC  . metoprolol  75 mg Oral BID  . potassium chloride  40 mEq Oral Daily  . sodium chloride  3 mL Intravenous Q12H  . vancomycin  500 mg Intravenous Q12H     Infusions: . sodium chloride 500 mL (07/21/13 0536)     PRN Medications:  sodium chloride, acetaminophen, bisacodyl, labetalol, ondansetron (ZOFRAN) IV, ondansetron, sodium chloride     Assessment/Plan    1. Afib - rates well controlled, continue current doses of metoprolol, diltiazem, and amiodarone. Continue short acting dilt for now with frequent dosing as she  may require further titration, consolidate to long acting prior to discharge.  - continue apixiban  2. Hypokalemia - please replace, goal to keep K at 4 and Mg at 2 in setting of arrhythmia  3. Hypernatremia - free water per primary team.   4.  Chronic mixed CHF: appears euvolemic, continue daily oral maintence lasix.      Dina Rich, M.D., F.A.C.C.

## 2013-07-27 NOTE — Clinical Social Work Note (Signed)
Meredith from [facility] spoke w family and assessed patient.  At this time, patient was observed eating while Sharyl Nimrod in room, probably does not meet criteria for residential hospice at this time.  Appears that family plans to take patient home at discharge, may be interested in hospice support at home when this is clinically appropriate.  Daughter (Mel) states that she wants mother to go to [facility], patient has not been offered a bed at [facility] at this time.  Has bed offer at [facility] only.  Santa Genera, LCSW Clinical Social Worker [phone])

## 2013-07-27 NOTE — Progress Notes (Signed)
[facility] PROGRESS NOTE  Laurie Ramirez WUJ:[id number] DOB: [birth year] DOA: 07/17/2013 PCP: Colette Ribas, MD  Assessment/Plan: 1. Atrial fibrillation with a ventricular response. Patient not controlled on oral Cardizem. Continue to titrate as needed. The patient is on anticoagulation. Appreciate cardiology assistance. 2. Acute on chronic combined congestive heart failure. Patient was initially volume overloaded on admission and required intravenous Lasix. This has since been transitioned to by mouth Lasix. Volume status appears to be compensated at this time. 3. Hypernatremia. Possibly related to overdiuresis as well as decreased by mouth intake. Continue D5W infusion and monitor serial serum sodium levels 4. Leukocytosis. Source is not entirely clear. She did have one out of 2 blood cultures positive for Streptococcus. She's currently on vancomycin. The skin appears to be improving. She's not been febrile. Will likely need to discuss this further with infectious disease to see course of treatment, pending family's decision regarding level of care. 5. Recent CVA, MCA territory with associated dysphagia and failure to thrive. Patient's family reports that she has not been adequately eating and drinking since her stroke. She is also essentially been nonverbal since her stroke. With the patient's need for daily diuretics, I am concerned that she would be very prone to develop dehydration and hypernatremia with little to no free water intake. I offered the patient's family the option of placing a PEG tube for nutrition/hydration purposes. I also offered a more comfort care approach, permitting the patient she can drink as she wishes, understanding that her overall condition would likely decline. It was explained that this was not uncommon to happen after a significant stroke such that the patient has suffered. The family will discuss it amongst themselves, but may be leaning more towards a  hospice approach. Her by mouth intake per staff has been very poor since she is refusing to open her mouth for any solid/liquid. We'll continue to monitor her tolerance to by mouth as her overall electrolytes improve.  Code Status: Full code Family Communication: Discussed with daughter at the bedside Disposition Plan: Pending decision by family.   Consultants:  Cardiology  Procedures:  none  Antibiotics:  Vancomycin 11/24  HPI/Subjective: Patient is nonverbal, awake, does not follow commands  Objective: Filed Vitals:   07/27/13 1046  BP: 136/77  Pulse: 99  Temp:   Resp:     Intake/Output Summary (Last 24 hours) at 07/27/13 1250 Last data filed at 07/27/13 0525  Gross per 24 hour  Intake 1492.5 ml  Output   1775 ml  Net -282.5 ml   Filed Weights   07/25/13 0500 07/26/13 0500 07/27/13 0524  Weight: 97.4 kg (214 lb 11.7 oz) 99.882 kg (220 lb 3.2 oz) 100.608 kg (221 lb 12.8 oz)    Exam:   General:  No acute distress, patient is nonverbal, awake, refusing to allow any food into her mouth  Cardiovascular: S1, S2, irregular  Respiratory: Clear to auscultation bilaterally, poor respiratory effort, does not follow commands  Abdomen: Soft, nontender, positive bowel sounds  Musculoskeletal: No edema bilaterally   Data Reviewed: Basic Metabolic Panel:  Recent Labs Lab 07/21/13 0735  07/24/13 0438 07/25/13 0501 07/26/13 0521 07/26/13 1540 07/27/13 0440  NA  --   < > 152* 155* 156* 156* 151*  K  --   < > 3.6 3.1* 2.9* 3.1* 2.8*  CL  --   < > 113* 117* 117* 117* 113*  CO2  --   < > 28 29 28 29 28   GLUCOSE  --   < >  182* 162* 143* 254* 178*  BUN  --   < > 30* 28* 26* 25* 23  CREATININE  --   < > 1.05 1.06 0.99 1.01 0.96  CALCIUM  --   < > 9.3 9.3 9.2 9.5 8.8  MG 1.5  --   --   --   --   --   --   < > = values in this interval not displayed. Liver Function Tests: No results found for this basename: AST, ALT, ALKPHOS, BILITOT, PROT, ALBUMIN,  in the last  168 hours No results found for this basename: LIPASE, AMYLASE,  in the last 168 hours No results found for this basename: AMMONIA,  in the last 168 hours CBC:  Recent Labs Lab 07/22/13 0543 07/25/13 0501  WBC 17.3* 15.0*  HGB 15.9* 14.7  HCT 50.3* 47.5*  MCV 96.4 97.1  PLT 276 333   Cardiac Enzymes: No results found for this basename: CKTOTAL, CKMB, CKMBINDEX, TROPONINI,  in the last 168 hours BNP (last 3 results)  Recent Labs  06/18/13 1015 06/27/13 1618  PROBNP 11470.0* 2520.0*   CBG:  Recent Labs Lab 07/21/13 1658  GLUCAP 149*    Recent Results (from the past 240 hour(s))  CULTURE, BLOOD (ROUTINE X 2)     Status: None   Collection Time    07/17/13  5:54 PM      Result Value Range Status   Specimen Description BLOOD RIGHT ANTECUBITAL   Final   Special Requests BOTTLES DRAWN AEROBIC ONLY [facility]   Final   Culture  Setup Time     Final   Value: 07/18/2013 20:30     Performed at [facility]   Culture     Final   Value: STREPTOCOCCUS CONSTELLATUS/MILLERI     Note: Gram Stain Report Called to,Read Back By and Verified With: Pasty Arch AT 2139 ON [id number] BY FORSYTH K     Performed at [facility]   Report Status 07/23/2013 FINAL   Final   Organism ID, Bacteria STREPTOCOCCUS CONSTELLATUS/MILLERI   Final  URINE CULTURE     Status: None   Collection Time    07/17/13  6:52 PM      Result Value Range Status   Specimen Description URINE, CATHETERIZED   Final   Special Requests NONE   Final   Culture  Setup Time     Final   Value: 07/17/2013 20:30     Performed at [facility] Count     Final   Value: 30,000 COLONIES/ML     Performed at [facility]   Culture     Final   Value: LACTOBACILLUS SPECIES     Note: Standardized susceptibility testing for this organism is not available.     Performed at [facility]   Report Status 07/19/2013 FINAL   Final  CULTURE, BLOOD (ROUTINE X 2)     Status: None   Collection Time     07/17/13  6:55 PM      Result Value Range Status   Specimen Description BLOOD LEFT ANTECUBITAL DRAWN BY RN   Final   Special Requests BOTTLES DRAWN AEROBIC AND ANAEROBIC 8CC EACH   Final   Culture NO GROWTH 5 DAYS   Final   Report Status 07/22/2013 FINAL   Final  MRSA PCR SCREENING     Status: Abnormal   Collection Time    07/17/13  8:04 PM      Result  Value Range Status   MRSA by PCR POSITIVE (*) NEGATIVE Final   Comment:            The GeneXpert MRSA Assay (FDA     approved for NASAL specimens     only), is one component of a     comprehensive MRSA colonization     surveillance program. It is not     intended to diagnose MRSA     infection nor to guide or     monitor treatment for     MRSA infections.     RESULT CALLED TO, READ BACK BY AND VERIFIED WITH:     H. MCLEOD AT 2317 ON 07/17/13 BY S. VANHOORNE  CULTURE, BLOOD (ROUTINE X 2)     Status: None   Collection Time    07/19/13  9:29 AM      Result Value Range Status   Specimen Description BLOOD RIGHT HAND   Final   Special Requests BOTTLES DRAWN AEROBIC ONLY 4CC   Final   Culture NO GROWTH 5 DAYS   Final   Report Status 07/24/2013 FINAL   Final  CULTURE, BLOOD (ROUTINE X 2)     Status: None   Collection Time    07/19/13  9:29 AM      Result Value Range Status   Specimen Description BLOOD RIGHT ANTECUBITAL   Final   Special Requests BOTTLES DRAWN AEROBIC AND ANAEROBIC 5CC   Final   Culture NO GROWTH 5 DAYS   Final   Report Status 07/24/2013 FINAL   Final     Studies: No results found.  Scheduled Meds: . amiodarone  200 mg Oral Daily  . antiseptic oral rinse  15 mL Mouth Rinse BID  . apixaban  5 mg Oral BID  . atorvastatin  10 mg Oral q1800  . cefTRIAXone (ROCEPHIN)  IV  1 g Intravenous Q24H  . diltiazem  60 mg Oral Q6H  . DULoxetine  30 mg Oral Daily  . furosemide  40 mg Oral Daily  . lubiprostone  24 mcg Oral BID WC  . metoprolol  75 mg Oral BID  . potassium chloride  40 mEq Oral Daily  . sodium chloride  3  mL Intravenous Q12H  . vancomycin  500 mg Intravenous Q12H   Continuous Infusions: . sodium chloride 500 mL (07/21/13 0536)    Principal Problem:   Acute on chronic diastolic CHF (congestive heart failure), NYHA class 2 Active Problems:   Atrial fibrillation   Atrial thrombus   CVA (cerebral infarction)    Time spent: , greater than 50% of time spent coordinating care with family regarding hospice services.    [facility]  [facility] Pager [phone]. If 7PM-7AM, please contact night-coverage at [email or web address], password [facility] 07/27/2013, 12:50 PM  LOS: 10 days

## 2013-07-27 NOTE — Progress Notes (Signed)
Inpatient Diabetes Program Recommendations  AACE/ADA: New Consensus Statement on Inpatient Glycemic Control (2013)  Target Ranges:  Prepandial:   less than 140 mg/dL      Peak postprandial:   less than 180 mg/dL (1-2 hours)      Critically ill patients:  140 - 180 mg/dL   Results for DAJAH, FISCHMAN (MRN [id number]) as of 07/27/2013 08:21  Ref. Range 07/25/2013 05:01 07/26/2013 05:21 07/26/2013 15:40 07/27/2013 04:40  Glucose Latest Range: 70-99 mg/dL 409 (H) 811 (H) 914 (H) 178 (H)    Inpatient Diabetes Program Recommendations Correction (SSI): Please consider ordering CBGs ACHS with Novolog correction while inpatient. HgbA1C: A1C on 07/12/13 noted to be 6.7%.  Note: Patient does not have a prior documented history of diabetes.  An A1C of 6.7% was obtained on 07/12/13 which meets ADA criteria for diagnosis of diabetes.  Initial lab glucose on 07/14/13 was 174 mg/dl at 7:82 am.  Lab glucose over the past 3 days has ranged from 143-254 mg/dl.  Please consider ordering CBGs ACHS with Novolog correction while inpatient.  MD-Please advise NURSING if patient will be given diagnosis of diabetes so patient and/or family can be educated prior to discharge.  Will continue to follow.  Thanks, Orlando Penner, RN, MSN, CCRN Diabetes Coordinator Inpatient Diabetes Program [phone] (Team Pager) [phone] ([facility] office) [phone] [facility] office)

## 2013-07-28 LAB — CBC
Hemoglobin: 14.4 g/dL (ref 12.0–15.0)
MCH: 30.1 pg (ref 26.0–34.0)
MCHC: 31.4 g/dL (ref 30.0–36.0)
MCV: 95.8 fL (ref 78.0–100.0)
Platelets: 381 10*3/uL (ref 150–400)
RDW: 15.6 % — ABNORMAL HIGH (ref 11.5–15.5)

## 2013-07-28 LAB — BASIC METABOLIC PANEL
BUN: 20 mg/dL (ref 6–23)
CO2: 30 mEq/L (ref 19–32)
Calcium: 9 mg/dL (ref 8.4–10.5)
GFR calc non Af Amer: 58 mL/min — ABNORMAL LOW (ref >90)
Glucose, Bld: 141 mg/dL — ABNORMAL HIGH (ref 70–99)
Potassium: 3 mEq/L — ABNORMAL LOW (ref 3.5–5.1)

## 2013-07-28 MED ORDER — LORAZEPAM 1 MG PO TABS: 1.0000 mg | ORAL_TABLET | Freq: Four times a day (QID) | ORAL | Status: DC | PRN

## 2013-07-28 MED ORDER — MORPHINE SULFATE (CONCENTRATE) 10 MG /0.5 ML PO SOLN: 10.0000 mg | ORAL | Status: DC | PRN

## 2013-07-28 MED ORDER — POTASSIUM CHLORIDE CRYS ER 20 MEQ PO TBCR
40.0000 meq | EXTENDED_RELEASE_TABLET | Freq: Two times a day (BID) | ORAL | Status: DC
  Administered 2013-07-28 – 2013-07-29 (×3): 40 meq via ORAL
  Filled 2013-07-28 (×3): qty 2

## 2013-07-28 MED ORDER — DILTIAZEM HCL 60 MG PO TABS
120.0000 mg | ORAL_TABLET | Freq: Two times a day (BID) | ORAL | Status: DC
  Administered 2013-07-28 – 2013-07-29 (×2): 120 mg via ORAL
  Filled 2013-07-28 (×2): qty 2

## 2013-07-28 NOTE — Clinical Social Work Note (Signed)
CSW met with pt's daughter, Melody and other family at bedside. Beth from Hospice feels pt is appropriate for hospice at home. Family would like to take pt home with hospice services. Plan is to d/c tomorrow. Left number for Melody to call Beth to discuss equipment.   Derenda Fennel, [place] [phone]

## 2013-07-28 NOTE — Progress Notes (Signed)
ANTIBIOTIC CONSULT NOTE  Pharmacy Consult for vancomycin Indication: leukocytosis/?bacteremia   No Known Allergies  Patient Measurements: Height: 5' (152.4 cm) Weight: 223 lb 15.8 oz (101.6 kg) IBW/kg (Calculated) : 45.5 Adjusted Body Weight: 70kg   Vital Signs: Temp: 97.5 F (36.4 C) (12/04 0800) Temp src: Axillary (12/04 0800) BP: 117/46 mmHg (12/04 0800) Pulse Rate: 90 (12/04 0800) Intake/Output from previous day: 12/03 0701 - 12/04 0700 In: 450 [IV Piggyback:450] Out: 1600 [Urine:1600] Intake/Output from this shift:    Labs:  Recent Labs  07/26/13 1540 07/27/13 0440 07/28/13 0426  WBC  --   --  14.2*  HGB  --   --  14.4  PLT  --   --  381  CREATININE 1.01 0.96 0.92   Estimated Creatinine Clearance: 53.1 ml/min (by C-G formula based on Cr of 0.92). No results found for this basename: VANCOTROUGH, Leodis Binet, VANCORANDOM, GENTTROUGH, GENTPEAK, GENTRANDOM, TOBRATROUGH, TOBRAPEAK, TOBRARND, AMIKACINPEAK, AMIKACINTROU, AMIKACIN,  in the last 72 hours  Microbiology: Recent Results (from the past 720 hour(s))  MRSA PCR SCREENING     Status: None   Collection Time    07/12/13  2:42 AM      Result Value Range Status   MRSA by PCR NEGATIVE  NEGATIVE Final   Comment:            The GeneXpert MRSA Assay (FDA     approved for NASAL specimens     only), is one component of a     comprehensive MRSA colonization     surveillance program. It is not     intended to diagnose MRSA     infection nor to guide or     monitor treatment for     MRSA infections.  CULTURE, BLOOD (ROUTINE X 2)     Status: None   Collection Time    07/17/13  5:54 PM      Result Value Range Status   Specimen Description BLOOD RIGHT ANTECUBITAL   Final   Special Requests BOTTLES DRAWN AEROBIC ONLY [facility]   Final   Culture  Setup Time     Final   Value: 07/18/2013 20:30     Performed at [facility]   Culture     Final   Value: STREPTOCOCCUS CONSTELLATUS/MILLERI     Note: Gram Stain Report  Called to,Read Back By and Verified With: Pasty Arch AT 2139 ON [id number] BY FORSYTH K     Performed at [facility]   Report Status 07/23/2013 FINAL   Final   Organism ID, Bacteria STREPTOCOCCUS CONSTELLATUS/MILLERI   Final  URINE CULTURE     Status: None   Collection Time    07/17/13  6:52 PM      Result Value Range Status   Specimen Description URINE, CATHETERIZED   Final   Special Requests NONE   Final   Culture  Setup Time     Final   Value: 07/17/2013 20:30     Performed at [facility] Count     Final   Value: 30,000 COLONIES/ML     Performed at [facility]   Culture     Final   Value: LACTOBACILLUS SPECIES     Note: Standardized susceptibility testing for this organism is not available.     Performed at [facility]   Report Status 07/19/2013 FINAL   Final  CULTURE, BLOOD (ROUTINE X 2)     Status: None   Collection Time  07/17/13  6:55 PM      Result Value Range Status   Specimen Description BLOOD LEFT ANTECUBITAL DRAWN BY RN   Final   Special Requests BOTTLES DRAWN AEROBIC AND ANAEROBIC 8CC EACH   Final   Culture NO GROWTH 5 DAYS   Final   Report Status 07/22/2013 FINAL   Final  MRSA PCR SCREENING     Status: Abnormal   Collection Time    07/17/13  8:04 PM      Result Value Range Status   MRSA by PCR POSITIVE (*) NEGATIVE Final   Comment:            The GeneXpert MRSA Assay (FDA     approved for NASAL specimens     only), is one component of a     comprehensive MRSA colonization     surveillance program. It is not     intended to diagnose MRSA     infection nor to guide or     monitor treatment for     MRSA infections.     RESULT CALLED TO, READ BACK BY AND VERIFIED WITH:     H. MCLEOD AT 2317 ON 07/17/13 BY S. VANHOORNE  CULTURE, BLOOD (ROUTINE X 2)     Status: None   Collection Time    07/19/13  9:29 AM      Result Value Range Status   Specimen Description BLOOD RIGHT HAND   Final   Special Requests BOTTLES DRAWN  AEROBIC ONLY 4CC   Final   Culture NO GROWTH 5 DAYS   Final   Report Status 07/24/2013 FINAL   Final  CULTURE, BLOOD (ROUTINE X 2)     Status: None   Collection Time    07/19/13  9:29 AM      Result Value Range Status   Specimen Description BLOOD RIGHT ANTECUBITAL   Final   Special Requests BOTTLES DRAWN AEROBIC AND ANAEROBIC 5CC   Final   Culture NO GROWTH 5 DAYS   Final   Report Status 07/24/2013 FINAL   Final   Medical History: Past Medical History  Diagnosis Date  . Essential hypertension, benign   . Atrial fibrillation   . Bronchitis   . Depression   . Chronic combined systolic and diastolic heart failure      LVEF 45-50%  . 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 Chronic systolic heart failure 06/18/2013   Medications:  Scheduled:  . amiodarone  200 mg Oral Daily  . antiseptic oral rinse  15 mL Mouth Rinse BID  . apixaban  5 mg Oral BID  . atorvastatin  10 mg Oral q1800  . diltiazem  120 mg Oral Q12H  . DULoxetine  30 mg Oral Daily  . furosemide  40 mg Oral Daily  . lubiprostone  24 mcg Oral BID WC  . metoprolol  75 mg Oral BID  . potassium chloride  40 mEq Oral BID  . sodium chloride  3 mL Intravenous Q12H  . vancomycin  500 mg Intravenous Q12H   Infusions:  . sodium chloride 500 mL (07/21/13 0536)   PRN: sodium chloride, acetaminophen, bisacodyl, labetalol, ondansetron (ZOFRAN) IV, ondansetron, sodium chloride  Assessment: 79yo female with + streptococci in 1/2 blood culture.  Repeat blood cx have show NGTD.   She was initially started on Rocephin for leukocytosis and Vancomycin added for +blood cx.  WBC remains elevated & essentially unchanged since admission.   Day #10 IV Vancomycin  Renal function is currently stable.  Goal of Therapy:  Vancomycin trough level 15-20 mcg/ml.    Plan:  1. Continue Vancomycin 500mg  IV q12h (consider stopping Vancomycin and observe) 2. Check weekly trough level as indicated 3. Monitor renal function and cx data   Valrie Hart  A 07/28/2013,11:07 AM

## 2013-07-28 NOTE — Progress Notes (Signed)
Consulting cardiologist: Wyline Mood, MD  Subjective:    Sleeping. Hard to arouse.   Objective:   Temp:  [97.5 F (36.4 C)-99.7 F (37.6 C)] 97.5 F (36.4 C) (12/04 0800) Pulse Rate:  [73-99] 90 (12/04 0800) Resp:  [9-29] 26 (12/04 0800) BP: (108-139)/(42-77) 117/46 mmHg (12/04 0800) SpO2:  [89 %-99 %] 95 % (12/04 0800) Weight:  [223 lb 15.8 oz (101.6 kg)] 223 lb 15.8 oz (101.6 kg) (12/04 0500) Last BM Date: 07/26/13  Filed Weights   07/26/13 0500 07/27/13 0524 07/28/13 0500  Weight: 220 lb 3.2 oz (99.882 kg) 221 lb 12.8 oz (100.608 kg) 223 lb 15.8 oz (101.6 kg)    Intake/Output Summary (Last 24 hours) at 07/28/13 0846 Last data filed at 07/28/13 0500  Gross per 24 hour  Intake    450 ml  Output   1600 ml  Net  -1150 ml    Telemetry: Atrial fibrillation rates in the 70'-90's.   Exam:  General: No acute distress. Sleeping.   HEENT: Conjunctiva and lids normal, oropharynx clear.  Lungs: Clear to auscultation, nonlabored, diminished in the bases, but does not take deep breaths. On O2..  Cardiac: No elevated JVP or bruits. IRRR, no gallop or rub.   Abdomen: Normoactive bowel sounds, nontender, nondistended.  Extremities: No pitting edema, distal pulses full.  Neuropsychiatric: Sleeping, hard to arouse.   Lab Results:  Basic Metabolic Panel:  Recent Labs Lab 07/26/13 1540 07/27/13 0440 07/28/13 0426  NA 156* 151* 148*  K 3.1* 2.8* 3.0*  CL 117* 113* 108  CO2 29 28 30   GLUCOSE 254* 178* 141*  BUN 25* 23 20  CREATININE 1.01 0.96 0.92  CALCIUM 9.5 8.8 9.0  MG  --  1.7  --     Liver Function Tests: No results found for this basename: AST, ALT, ALKPHOS, BILITOT, PROT, ALBUMIN,  in the last 168 hours  CBC:  Recent Labs Lab 07/22/13 0543 07/25/13 0501 07/28/13 0426  WBC 17.3* 15.0* 14.2*  HGB 15.9* 14.7 14.4  HCT 50.3* 47.5* 45.8  MCV 96.4 97.1 95.8  PLT 276 333 381     BNP:  Recent Labs  06/18/13 1015 06/27/13 1618  PROBNP 11470.0*  2520.0*      Medications:   Scheduled Medications: . amiodarone  200 mg Oral Daily  . antiseptic oral rinse  15 mL Mouth Rinse BID  . apixaban  5 mg Oral BID  . atorvastatin  10 mg Oral q1800  . cefTRIAXone (ROCEPHIN)  IV  1 g Intravenous Q24H  . diltiazem  60 mg Oral Q6H  . DULoxetine  30 mg Oral Daily  . furosemide  40 mg Oral Daily  . lubiprostone  24 mcg Oral BID WC  . metoprolol  75 mg Oral BID  . potassium chloride  40 mEq Oral Daily  . sodium chloride  3 mL Intravenous Q12H  . vancomycin  500 mg Intravenous Q12H    Infusions: . sodium chloride 500 mL (07/21/13 0536)    PRN Medications: sodium chloride, acetaminophen, bisacodyl, labetalol, ondansetron (ZOFRAN) IV, ondansetron, sodium chloride   Assessment and Plan:   1. Atrial fibrillation: No evidence of documented RVR. She is tolerating BB, CCB, and amiodarone. Will change to long acting today to ascertain if this will be enough for her, in anticipation of discharge, with prn extra doses if necessary. Will start with BID dosing of 120 mg.  Continue on apixaban. Review of CBC does not show evidence of anemia but will need  to hemoccult stools at home.    2. Mixed CHF: Appears stable without evidence of fluid overload on exam. Difficult to auscultate lung sounds. Continue with po lasix. Wt has increased since admission.   3. Hypokalemia: She is 3.0 this am. She has been given replacement. Will increase potassium dose to avoid need for frequent replacement. Consider spironolactone,. Creatinine 0.92.   4. . Hypernatremia: Improved with free water.   Bettey Mare. Lyman Bishop NP [facility] 07/28/2013, 8:46 AM  Attending Note Patient seen and discussed with NP Lyman Bishop. Rates well controlled on current regimen, will consolidate dilt dosing and put her on long acting 120mg  bid, continue metoprolol and amiodarone. Continue apixiban for anticoagulation. From a volume standpoint she appears euvolemic, will continue oral  lasix. Her previously active cardiac issues are stabilized, we will sign off. Please recontact with any questions or if her status changes. She may follow up with Dr Diona Browner 3 weeks after discharge.   Dina Rich MD

## 2013-07-28 NOTE — Progress Notes (Signed)
[facility] PROGRESS NOTE  Laurie Ramirez AVW:[id number] DOB: [birth year] DOA: 07/17/2013 PCP: Colette Ribas, MD  Assessment/Plan: 1. Atrial fibrillation with a ventricular response. Patient not controlled on oral Cardizem. Continue to titrate as needed. The patient is on anticoagulation. Appreciate cardiology assistance. 2. Acute on chronic combined congestive heart failure. Patient was initially volume overloaded on admission and required intravenous Lasix. This has since been transitioned to by mouth Lasix. Volume status appears to be compensated at this time. 3. Hypernatremia. Possibly related to overdiuresis as well as decreased by mouth intake. Continue D5W infusion and monitor serial serum sodium levels 4. Leukocytosis. Source is not entirely clear. She did have one out of 2 blood cultures positive for Streptococcus. She's currently on vancomycin. She's not been afebrile. Will discontinue antibiotics 5. Recent CVA, MCA territory with associated dysphagia and failure to thrive. Patient's family reports that she has not been adequately eating and drinking since her stroke. She is also essentially been nonverbal since her stroke. With the patient's need for daily diuretics, I am concerned that she would be very prone to develop dehydration and hypernatremia with little to no free water intake. Her overall clinical condition clearly appears to be declining.  I have recommended a comfort care approach and family is agreeable to take the patient home with hospice services.  Code Status: DNR, comfort care approach Family Communication: Discussed with daughter at the bedside Disposition Plan: Family decided to make the patient comfort care on hospice and plans to discharge home with hospice services.   Consultants:  Cardiology  Procedures:  none  Antibiotics:  Vancomycin 11/24  HPI/Subjective: Patient is nonverbal, awake, does not follow commands  Objective: Filed Vitals:    07/28/13 1100  BP: 115/43  Pulse: 45  Temp:   Resp: 19    Intake/Output Summary (Last 24 hours) at 07/28/13 1146 Last data filed at 07/28/13 0500  Gross per 24 hour  Intake    450 ml  Output   1600 ml  Net  -1150 ml   Filed Weights   07/26/13 0500 07/27/13 0524 07/28/13 0500  Weight: 99.882 kg (220 lb 3.2 oz) 100.608 kg (221 lb 12.8 oz) 101.6 kg (223 lb 15.8 oz)    Exam:   General:  No acute distress, patient is nonverbal, lethargic  Cardiovascular: S1, S2, irregular  Respiratory: Clear to auscultation bilaterally, poor respiratory effort, does not follow commands  Abdomen: Soft, nontender, positive bowel sounds  Musculoskeletal: No edema bilaterally   Data Reviewed: Basic Metabolic Panel:  Recent Labs Lab 07/25/13 0501 07/26/13 0521 07/26/13 1540 07/27/13 0440 07/28/13 0426  NA 155* 156* 156* 151* 148*  K 3.1* 2.9* 3.1* 2.8* 3.0*  CL 117* 117* 117* 113* 108  CO2 29 28 29 28 30   GLUCOSE 162* 143* 254* 178* 141*  BUN 28* 26* 25* 23 20  CREATININE 1.06 0.99 1.01 0.96 0.92  CALCIUM 9.3 9.2 9.5 8.8 9.0  MG  --   --   --  1.7  --    Liver Function Tests: No results found for this basename: AST, ALT, ALKPHOS, BILITOT, PROT, ALBUMIN,  in the last 168 hours No results found for this basename: LIPASE, AMYLASE,  in the last 168 hours No results found for this basename: AMMONIA,  in the last 168 hours CBC:  Recent Labs Lab 07/22/13 0543 07/25/13 0501 07/28/13 0426  WBC 17.3* 15.0* 14.2*  HGB 15.9* 14.7 14.4  HCT 50.3* 47.5* 45.8  MCV 96.4 97.1 95.8  PLT  276 333 381   Cardiac Enzymes: No results found for this basename: CKTOTAL, CKMB, CKMBINDEX, TROPONINI,  in the last 168 hours BNP (last 3 results)  Recent Labs  06/18/13 1015 06/27/13 1618  PROBNP 11470.0* 2520.0*   CBG:  Recent Labs Lab 07/21/13 1658  GLUCAP 149*    Recent Results (from the past 240 hour(s))  CULTURE, BLOOD (ROUTINE X 2)     Status: None   Collection Time    07/19/13   9:29 AM      Result Value Range Status   Specimen Description BLOOD RIGHT HAND   Final   Special Requests BOTTLES DRAWN AEROBIC ONLY 4CC   Final   Culture NO GROWTH 5 DAYS   Final   Report Status 07/24/2013 FINAL   Final  CULTURE, BLOOD (ROUTINE X 2)     Status: None   Collection Time    07/19/13  9:29 AM      Result Value Range Status   Specimen Description BLOOD RIGHT ANTECUBITAL   Final   Special Requests BOTTLES DRAWN AEROBIC AND ANAEROBIC 5CC   Final   Culture NO GROWTH 5 DAYS   Final   Report Status 07/24/2013 FINAL   Final     Studies: No results found.  Scheduled Meds: . amiodarone  200 mg Oral Daily  . antiseptic oral rinse  15 mL Mouth Rinse BID  . diltiazem  120 mg Oral Q12H  . furosemide  40 mg Oral Daily  . metoprolol  75 mg Oral BID  . potassium chloride  40 mEq Oral BID   Continuous Infusions: . sodium chloride 500 mL (07/21/13 0536)    Principal Problem:   Acute on chronic diastolic CHF (congestive heart failure), NYHA class 2 Active Problems:   Atrial fibrillation   Atrial thrombus   CVA (cerebral infarction)    Time spent:    [facility]  [facility] Pager [phone]. If 7PM-7AM, please contact night-coverage at [email or web address], password [facility] 07/28/2013, 11:46 AM  LOS: 11 days

## 2013-07-29 DIAGNOSIS — N390 Urinary tract infection, site not specified: Secondary | ICD-10-CM

## 2013-07-29 LAB — BASIC METABOLIC PANEL
CO2: 33 mEq/L — ABNORMAL HIGH (ref 19–32)
Calcium: 9 mg/dL (ref 8.4–10.5)
Creatinine, Ser: 0.85 mg/dL (ref 0.50–1.10)
GFR calc Af Amer: 74 mL/min — ABNORMAL LOW (ref >90)
GFR calc non Af Amer: 63 mL/min — ABNORMAL LOW (ref >90)
Glucose, Bld: 139 mg/dL — ABNORMAL HIGH (ref 70–99)
Sodium: 146 mEq/L — ABNORMAL HIGH (ref 135–145)

## 2013-07-29 MED ORDER — MORPHINE SULFATE (CONCENTRATE) 10 MG /0.5 ML PO SOLN: 10.0000 mg | ORAL | Status: AC | PRN

## 2013-07-29 MED ORDER — LORAZEPAM 1 MG PO TABS: 1.0000 mg | ORAL_TABLET | Freq: Four times a day (QID) | ORAL | Status: AC | PRN

## 2013-07-29 NOTE — Clinical Social Work Note (Signed)
Family taking patient home w hospice support, family says they have spoken w Beth at hospice already.  Says they have received delivery of equipment needed at home (oxygen, hospital bed, bedside table).  CSW will call EMS to [address], [place] when discharged at family request.  Santa Genera, LCSW Clinical Social Worker [phone])

## 2013-07-29 NOTE — Progress Notes (Signed)
EMS here to transport pt home.  Family member at bedside, aware of transport.

## 2013-07-29 NOTE — Plan of Care (Signed)
Problem: Phase II Progression Outcomes Goal: Walk in hall or up in chair TID Outcome: Adequate for Discharge Patient unable to ambulate

## 2013-07-29 NOTE — Discharge Summary (Addendum)
Physician Discharge Summary  MANJU KULKARNI QIO:[id number] DOB: [birth year] DOA: 07/17/2013  PCP: Colette Ribas, MD  Admit date: 07/17/2013 Discharge date: 07/29/2013  Time spent: 45 minutes  Recommendations for Outpatient Follow-up:  1. Patient discharged home with hospice services for comfort care   Discharge Diagnoses:  Principal Problem:   Acute on chronic diastolic CHF (congestive heart failure), NYHA class 2 Active Problems:   Atrial fibrillation   Atrial thrombus   CVA (cerebral infarction) Sepsis Failure to thrive Hypernatremia Decreased oral intake Nonverbal due to stroke  Discharge Condition: stable  Diet recommendation: regular diet as tolerated  Filed Weights   07/27/13 0524 07/28/13 0500 07/29/13 0425  Weight: 100.608 kg (221 lb 12.8 oz) 101.6 kg (223 lb 15.8 oz) 97.16 kg (214 lb 3.2 oz)    History of present illness:  79 year old female who was brought from [facility] nursing home for shortness of breath. Patient is nonverbal due to recent CVA. Patient was discharged from [facility] after patient was found to have CVA and atrial thrombus. As per daughter patient started having shortness of breath an irregular heartbeat social sent to the ED for further evaluation. Patient is unable to provide any history. She was seen by cardiology at [facility] and started on anticoagulation with eliquis. And the amiodarone was restarted for heart rate control.  Daughter at bedside says that she did not have nausea vomiting or diarrhea. She had fever.   Hospital Course:  This patient was admitted to the hospital with acute on chronic diastolic congestive heart failure as well as atrial fibrillation with RVR. The patient was recently diagnosed with an acute stroke and was sent to a nursing home. She quickly developed shortness of breath and required transfer back to the hospital. The patient was started on IV Lasix as well as a Cardizem infusion. She was admitted to the step  down unit for evaluation. She is followed by cardiology continued on anticoagulation. Her heart rate came under better control with metoprolol, amiodarone and Cardizem. She was continued on Lasix and since that she is euvolemic. Unfortunately, her by mouth intake has been very poor since her stroke. The patient is essentially nonverbal. Her hypernatremia was likely due to decreased by mouth intake in the setting of diuresis. She was started back on IV fluid with since corrected her sodium. I have on this discussion with patient's family regarding her prognosis. Since her PO intake was minimal, and her free water intake was also very poor, she was likely prone to become dehydrated and hyponatremic once again, especially in the setting of ongoing diuretic use. I offered the patient's family the option for PEG tube. After consideration, the family decided not to pursue any artificial nutrition or hydration. I advocated for a comfort care approach which I think it is most appropriate in this patient's long-term prognosis. The family is in agreement with this. At this time and elected to discharge home with hospice care services. The main focus of her care should be her comfort. I have simplified her medications, discontinued anticoagulation as well as other medications not regulate her comfort. I feel that her antihypertensives and rate control medication can be discontinued as her clinical condition declines. We'll continue Lasix for now as it has help with prevention of fluid overload, but this may also be discontinued as her clinical condition declines. The patient is ready for discharge home today.  Procedures:  none  Consultations:  Cardiology  Discharge Exam: Filed Vitals:   07/29/13 0425  BP: 129/78  Pulse: 98  Temp: 98.1 F (36.7 C)  Resp: 16    General: NAD Cardiovascular: S1, S2 RRR Respiratory: CTA B  Discharge Instructions  Discharge Orders   Future Appointments Provider Department  Dept Phone   09/05/2013 11:20 AM Jonelle Sidle, MD [facility] [place] [phone]   Future Orders Complete By Expires   Diet general  As directed    Increase activity slowly  As directed        Medication List    STOP taking these medications       apixaban 5 MG Tabs tablet  Commonly known as:  ELIQUIS     atorvastatin 10 MG tablet  Commonly known as:  LIPITOR     DULoxetine 30 MG capsule  Commonly known as:  CYMBALTA     lisinopril-hydrochlorothiazide 20-12.5 MG per tablet  Commonly known as:  PRINZIDE,ZESTORETIC      TAKE these medications       acetaminophen 500 MG tablet  Commonly known as:  TYLENOL  Take 1,000 mg by mouth every 6 (six) hours as needed for pain.     amiodarone 200 MG tablet  Commonly known as:  PACERONE  Take 1 tablet (200 mg total) by mouth daily.     diltiazem 240 MG 24 hr capsule  Commonly known as:  CARDIZEM CD  Take 1 capsule (240 mg total) by mouth daily.     furosemide 40 MG tablet  Commonly known as:  LASIX  Take 40 mg by mouth daily.     LORazepam 1 MG tablet  Commonly known as:  ATIVAN  Take 1 tablet (1 mg total) by mouth every 6 (six) hours as needed for anxiety.     metoprolol 50 MG tablet  Commonly known as:  LOPRESSOR  Take 1 tablet (50 mg total) by mouth 2 (two) times daily.     morphine CONCENTRATE 10 mg / 0.5 ml concentrated solution  Take 0.5 mLs (10 mg total) by mouth every 2 (two) hours as needed for severe pain.     PATADAY 0.2 % Soln  Generic drug:  Olopatadine HCl  Apply 1 drop to eye daily as needed (allergies).     potassium chloride SA 20 MEQ tablet  Commonly known as:  K-DUR,KLOR-CON  Take 2 tablets (40 mEq total) by mouth daily.     senna 8.6 MG tablet  Commonly known as:  SENOKOT  Take 2 tablets by mouth 2 (two) times daily.     ZOFRAN 4 MG tablet  Generic drug:  ondansetron  Take 4 mg by mouth every 8 (eight) hours as needed for nausea or vomiting.       No Known Allergies      Follow-up Information   Follow up with Colette Ribas, MD. (As needed)    Specialty:  Family Medicine   Contact information:   [phone] [place] [address] [phone]        The results of significant diagnostics from this hospitalization (including imaging, microbiology, ancillary and laboratory) are listed below for reference.    Significant Diagnostic Studies: Ct Head Wo Contrast  07/12/2013   CLINICAL DATA:  TPA treatment, 24 hr followup.  EXAM: CT HEAD WITHOUT CONTRAST  TECHNIQUE: Contiguous axial images were obtained from the base of the skull through the vertex without intravenous contrast.  COMPARISON:  07/12/2013 at 12:28 a.m.  FINDINGS: Abnormal hypodensity and loss of gray-white differentiation in the left MCA  distribution is compatible with a large left MCA distribution infarct. No hemorrhagic transformation observed.  Periventricular white matter and corona radiata hypodensities favor chronic ischemic microvascular white matter disease.  No midline shift. No ventricular effacement. No significant extra-axial fluid collection. No mass lesion noted.  IMPRESSION: 1. Large region of hypodensity in the left MCA distribution compatible with MCA infarct. No hemorrhagic transformation observed.   Electronically Signed   By: Herbie Baltimore M.D.   On: 07/12/2013 22:59   Ct Head Wo Contrast  07/12/2013   CLINICAL DATA:  Altered mental status, currently not verbally responsive.  EXAM: CT HEAD WITHOUT CONTRAST  TECHNIQUE: Contiguous axial images were obtained from the base of the skull through the vertex without intravenous contrast.  COMPARISON:  05/17/2013  FINDINGS: The head is tilted and I suspect that fortuitous imaging cuts cause the left MCA to be slightly more readily apparent than the right. This is not thought to be specific for dense left MCA sign.  The brainstem, cerebellum, cerebral peduncles, thalamus, basal ganglia, basilar cisterns, and  ventricular system appear within normal limits. Periventricular white matter and corona radiata hypodensities favor chronic ischemic microvascular white matter disease. No intracranial hemorrhage, mass lesion, or acute CVA. Prior left frontal scalp hematoma has essentially resolved.  IMPRESSION: 1. Periventricular white matter and corona radiata hypodensities favor chronic ischemic microvascular white matter disease. No definite acute intracranial findings.   Electronically Signed   By: Herbie Baltimore M.D.   On: 07/12/2013 00:43   Dg Chest Port 1 View  07/19/2013   CLINICAL DATA:  Pneumonia.  EXAM: PORTABLE CHEST - 1 VIEW  COMPARISON:  07/17/2013.  FINDINGS: Previously identified pulmonary venous congestion and interstitial edema has resolved. Heart size remains prominent with normal pulmonary vascularity. No pleural effusion or focal infiltrate. Degenerative changes both shoulders.  IMPRESSION: Interim resolution of pulmonary venous congestion and interstitial pulmonary edema.   Electronically Signed   By: Maisie Fus  Register   On: 07/19/2013 15:02   Dg Chest Portable 1 View  07/17/2013   CLINICAL DATA:  Atrial fibrillation, tachypnea, shortness of breath  EXAM: PORTABLE CHEST - 1 VIEW  COMPARISON:  07/12/2013  FINDINGS: Moderate cardiac enlargement. Aortic arch calcification. Mild vascular congestion and interstitial prominence. No consolidation or effusion.  IMPRESSION: Cardiomegaly with vascular congestion.  Minimal interstitial edema.   Electronically Signed   By: Esperanza Heir M.D.   On: 07/17/2013 18:00   Dg Chest Port 1 View  07/12/2013   CLINICAL DATA:  Stroke.  EXAM: PORTABLE CHEST - 1 VIEW  COMPARISON:  June 27, 2013.  FINDINGS: Stable mild cardiomegaly. No acute pulmonary disease is noted. No pleural effusion or pneumothorax is noted. Bony thorax is intact.  IMPRESSION: No acute cardiopulmonary abnormality seen.   Electronically Signed   By: Roque Lias M.D.   On: 07/12/2013 18:42   Dg  Abd Portable 1v  07/24/2013   CLINICAL DATA:  Small bowel obstruction. Evaluate for interval change.  EXAM: PORTABLE ABDOMEN - 1 VIEW  COMPARISON:  None.  FINDINGS: Single portable supine view of the abdomen and pelvis. The far right-sided abdomen is excluded. Contrast is seen within normal caliber colon. Relative paucity of small bowel gas, nonspecific. Right hemidiaphragm elevation. No gross free intraperitoneal air or abnormal abdominal calcifications.  IMPRESSION: Paucity of small bowel gas, nonspecific. No specific evidence of bowel obstruction or free intraperitoneal air.  Exclusion of the for right-sided abdomen.   Electronically Signed   By: Jeronimo Greaves M.D.   On:  07/24/2013 20:00   Dg Swallowing Func-speech Pathology  07/14/2013   Riley Nearing Deblois, CCC-SLP     07/14/2013  3:04 PM Objective Swallowing Evaluation: Modified Barium Swallowing Study   Patient Details  Name: BAYLIN CABAL MRN: [id number] Date of Birth: [birth year]  Today's Date: 07/14/2013 Time: 1330-1400 SLP Time Calculation (min): 30 min  Past Medical History:  Past Medical History  Diagnosis Date  . Essential hypertension, benign   . Atrial fibrillation   . Bronchitis   . Depression   . Chronic combined systolic and diastolic heart failure      LVEF 45-50%  . 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 Chronic systolic heart failure 06/18/2013   Past Surgical History:  Past Surgical History  Procedure Laterality Date  . Shoulder surgery      right   HPI:  79 year old female with a history of CHF, A. fib, presented to  [facility] on 11/14 with nausea and vomiting. She was being  treated for presumed pancreatitis and renal failure felt to be  due to nausea and vomiting.  She was seen by nursing at 9:30pm  and at that time appeared to be in her normal state talking and  moving her right side. Possible apical thrombus on echo with  signs and symptoms of a large left MCA infarct. She has received  IV TPA.     Assessment / Plan / Recommendation  Clinical Impression  Dysphagia Diagnosis: Moderate oral phase dysphagia;Mild  pharyngeal phase dysphagia Clinical impression: Pt presents with moderate deficits,  primarily due to slow initiation of oral movement for bolus  transit. Pt has reduced opening of mouth for boluses, anterior  spillage. Pt requires spoon feeding. Despite weakness, oral  residuals are mild. There is a delayed intiation of the swallow  response; puree and honey thick liquids are tolerated well but  nectar thick liquids result in sensed aspiration before the  swallow. Strength of pharyngeal funciton WNL, no pharyngeal  residuals observed. Recommend pt proceed with Dys 1/pudding thick  liquids, SLP will likely upgrade pt to honey thick liqiuds  tomorrow at bedside.     Treatment Recommendation  Therapy as outlined in treatment plan below    Diet Recommendation Dysphagia 1 (Puree);Pudding-thick liquid   Liquid Administration via: Spoon Medication Administration: Crushed with puree Supervision: Staff to assist with self feeding Compensations: Slow rate;Small sips/bites;Check for anterior loss Postural Changes and/or Swallow Maneuvers: Seated upright 90  degrees    Other  Recommendations Oral Care Recommendations: Oral care Q4  per protocol;Oral care BID Other Recommendations: Have oral suction available   Follow Up Recommendations  Inpatient Rehab    Frequency and Duration min 2x/week  2 weeks   Pertinent Vitals/Pain NA    SLP Swallow Goals     General HPI: 79 year old female with a history of CHF, A. fib,  presented to [facility] on 11/14 with nausea and vomiting. She  was being treated for presumed pancreatitis and renal failure  felt to be due to nausea and vomiting.  She was seen by nursing  at 9:30pm and at that time appeared to be in her normal state  talking and moving her right side. Possible apical thrombus on  echo with signs and symptoms of a large left MCA infarct. She has  received IV TPA. Type of Study: Modified Barium Swallowing  Study Reason for Referral: Objectively evaluate swallowing function Diet Prior to this Study: Dysphagia 1 (puree);Pudding-thick  liquids Temperature Spikes Noted: No Respiratory Status: Nasal cannula History  of Recent Intubation: No Behavior/Cognition: Alert Oral Cavity - Dentition: Dentures, top;Dentures, bottom Oral Motor / Sensory Function: Impaired - see Bedside swallow  eval Self-Feeding Abilities: Total assist Patient Positioning: Upright in chair Baseline Vocal Quality:  (does not initiate speech) Volitional Cough: Cognitively unable to elicit Volitional Swallow: Unable to elicit    Reason for Referral Objectively evaluate swallowing function   Oral Phase Oral Preparation/Oral Phase Oral Phase: Impaired Oral - Honey Oral - Honey Teaspoon: Left anterior bolus loss;Right anterior  bolus loss;Holding of bolus;Delayed oral transit Oral - Nectar Oral - Nectar Teaspoon: Left anterior bolus loss;Right anterior  bolus loss;Holding of bolus;Delayed oral transit Oral - Solids Oral - Puree: Left anterior bolus loss;Right anterior bolus  loss;Holding of bolus;Delayed oral transit Oral - Pill: Holding of bolus   Pharyngeal Phase Pharyngeal Phase Pharyngeal Phase: Impaired Pharyngeal - Honey Pharyngeal - Honey Teaspoon: Delayed swallow initiation Pharyngeal - Nectar Pharyngeal - Nectar Teaspoon: Delayed swallow  initiation;Premature spillage to pyriform  sinuses;Penetration/Aspiration before swallow;Moderate aspiration Penetration/Aspiration details (nectar teaspoon): Material enters  airway, passes BELOW cords and not ejected out despite cough  attempt by patient Pharyngeal - Solids Pharyngeal - Puree: Delayed swallow initiation  Cervical Esophageal Phase    GO   Harlon Ditty, MA CCC-SLP [phone]            Claudine Mouton 07/14/2013, 3:03 PM     Microbiology: No results found for this or any previous visit (from the past 240 hour(s)).   Labs: Basic Metabolic Panel:  Recent Labs Lab 07/26/13 0521  07/26/13 1540 07/27/13 0440 07/28/13 0426 07/29/13 0600  NA 156* 156* 151* 148* 146*  K 2.9* 3.1* 2.8* 3.0* 3.1*  CL 117* 117* 113* 108 107  CO2 28 29 28 30  33*  GLUCOSE 143* 254* 178* 141* 139*  BUN 26* 25* 23 20 19   CREATININE 0.99 1.01 0.96 0.92 0.85  CALCIUM 9.2 9.5 8.8 9.0 9.0  MG  --   --  1.7  --   --    Liver Function Tests: No results found for this basename: AST, ALT, ALKPHOS, BILITOT, PROT, ALBUMIN,  in the last 168 hours No results found for this basename: LIPASE, AMYLASE,  in the last 168 hours No results found for this basename: AMMONIA,  in the last 168 hours CBC:  Recent Labs Lab 07/25/13 0501 07/28/13 0426  WBC 15.0* 14.2*  HGB 14.7 14.4  HCT 47.5* 45.8  MCV 97.1 95.8  PLT 333 381   Cardiac Enzymes: No results found for this basename: CKTOTAL, CKMB, CKMBINDEX, TROPONINI,  in the last 168 hours BNP: BNP (last 3 results)  Recent Labs  06/18/13 1015 06/27/13 1618  PROBNP 11470.0* 2520.0*   CBG: No results found for this basename: GLUCAP,  in the last 168 hours     Signed:  Ab Leaming  [facility] 07/29/2013, 8:40 PM

## 2013-07-29 NOTE — Progress Notes (Signed)
Nutrition Brief Note  Chart reviewed. Pt now transitioning to comfort care.  No further nutrition interventions warranted at this time.  Please re-consult as needed.   Heyden Jaber A. Cherron Blitzer, RD, LDN Pager: [phone]  

## 2013-07-29 NOTE — Progress Notes (Signed)
Discharge instructions and new and changed medications discussed with Ms Alona Bene, the patients daughter, she verbalizes understanding.  Discussed list of medications to discontinue.  Hospice has been arranged by case management to assist in managing patient at home.  Pt is drowsy and arousable at time.  EMS arranged for transportation home, pt's daughter remains at the bedside.  Teachback completed.

## 2013-07-29 NOTE — Clinical Social Work Note (Signed)
Patient ready for discharge today, will discharge home w hospice support.  Family agreeable, has been in contact w Beth of [facility].  RN CM arranging referral.  EMS called to transport patient to home - asked to transport in approx one hour.  CSW signing off as no further SW needs identified.    Santa Genera, LCSW Clinical Social Worker [phone])

## 2013-08-12 ENCOUNTER — Telehealth: Payer: Self-pay

## 2013-08-12 NOTE — Telephone Encounter (Signed)
Patient past away @ Home per Obituary in [place] [employer] °

## 2013-08-25 DEATH — deceased

## 2013-09-05 ENCOUNTER — Encounter: Payer: Medicare Other | Admitting: Cardiology

## 2013-12-14 NOTE — Telephone Encounter (Signed)
Encounter has been closed--TP 12/14/13 

## 2015-06-08 IMAGING — CT CT HEAD W/O CM
1 of 2 series · 16 of 30 positions shown, 20 images · non-contrast
Comparison: 07/12/2013 at [DATE] a.m.

CLINICAL DATA: TPA treatment, 24 hr followup.

EXAM:
CT HEAD WITHOUT CONTRAST
TECHNIQUE: Contiguous axial images were obtained from the base of the skull
through the vertex without intravenous contrast.

[Series 3: head 5.0 h30s · axial · 0.59mm/px · z∈[+1006,+1156]mm · 16 of 34 slices shown, 20 images]
[im 2/34  brain]
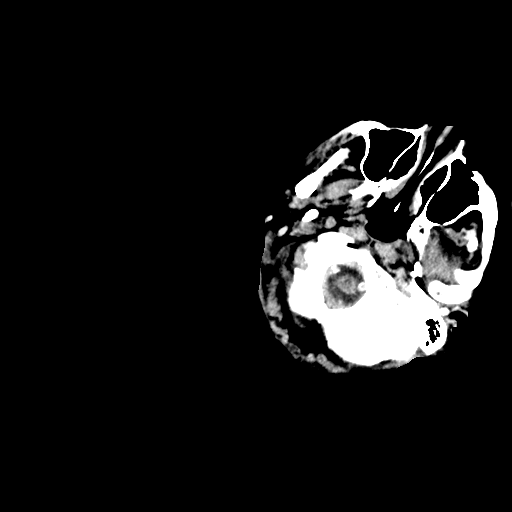
[im 2/34  bone]
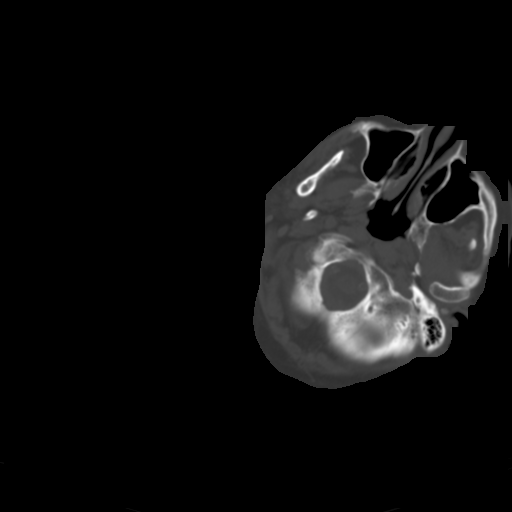
[im 4/34  brain]
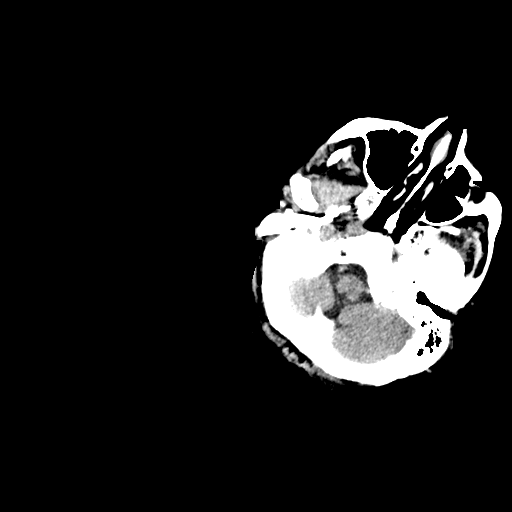
[im 6/34  brain]
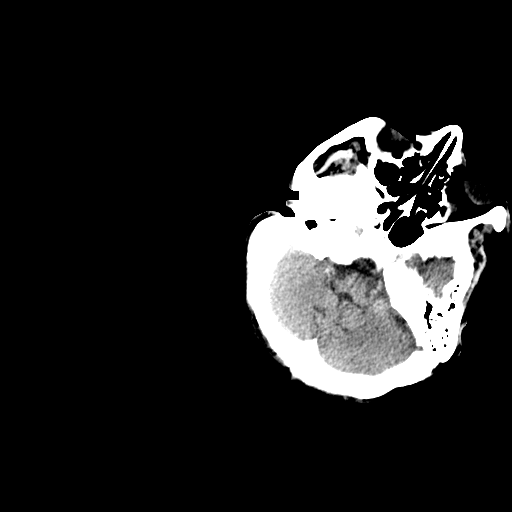
[im 7/34  brain]
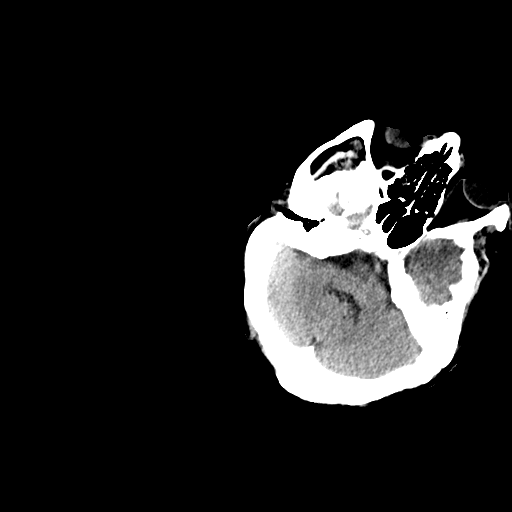
[im 11/34  brain]
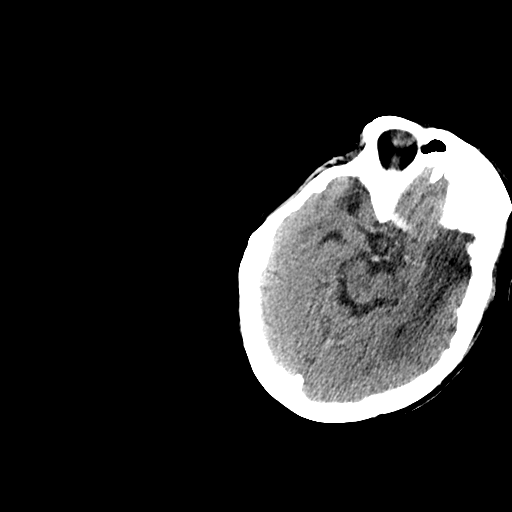
[im 11/34  bone]
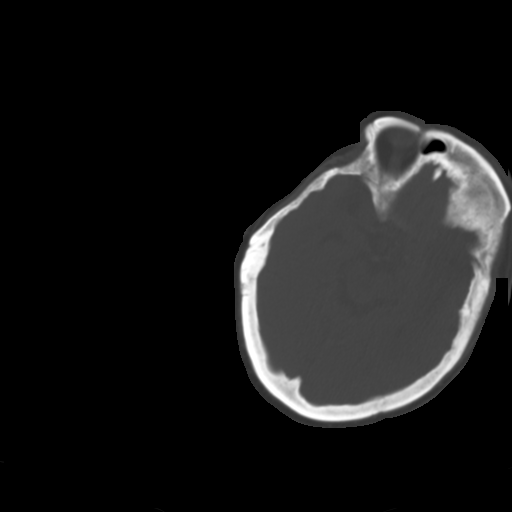
[im 13/34  brain]
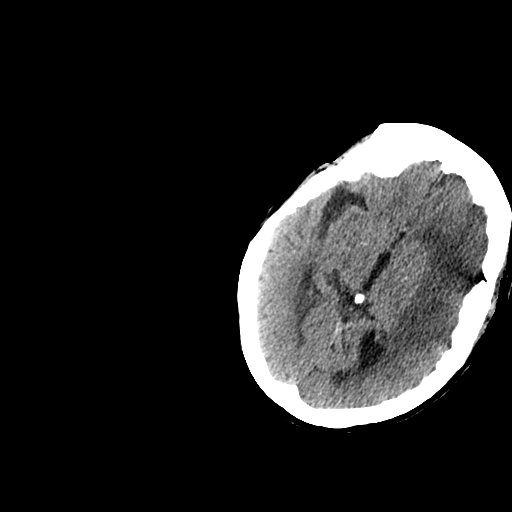
[im 14/34  brain]
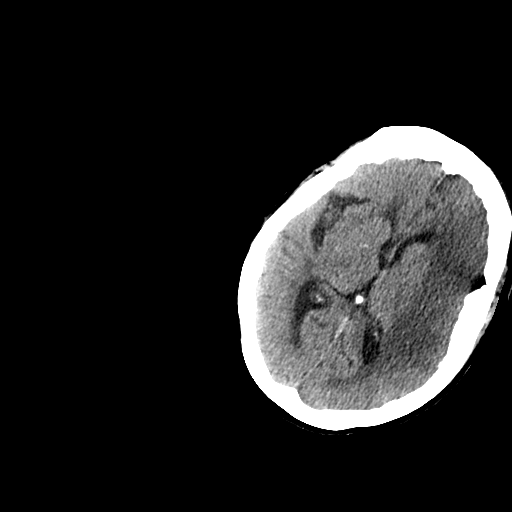
[im 16/34  brain]
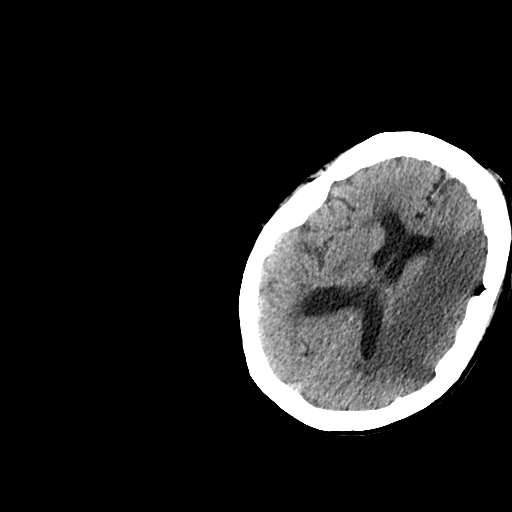
[im 18/34  brain]
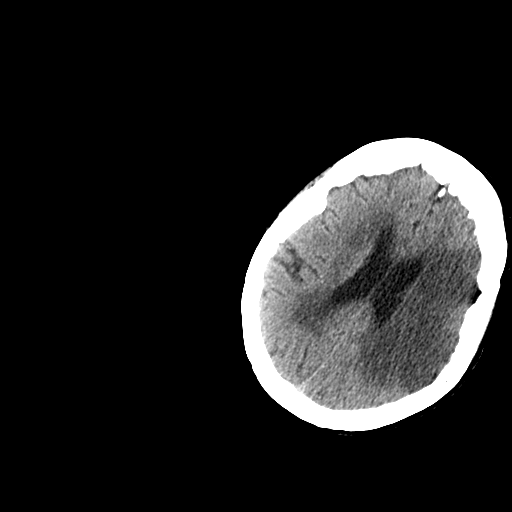
[im 18/34  bone]
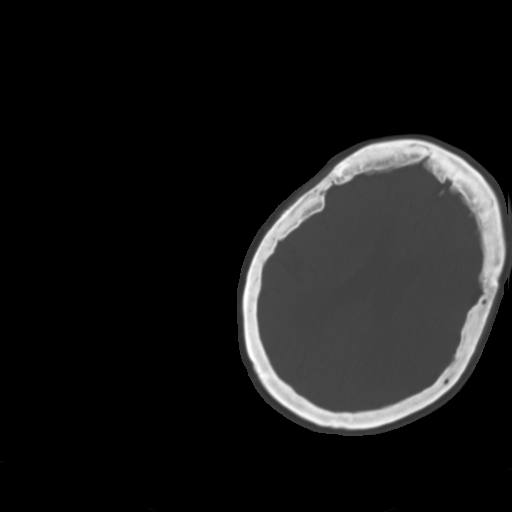
[im 20/34  brain]
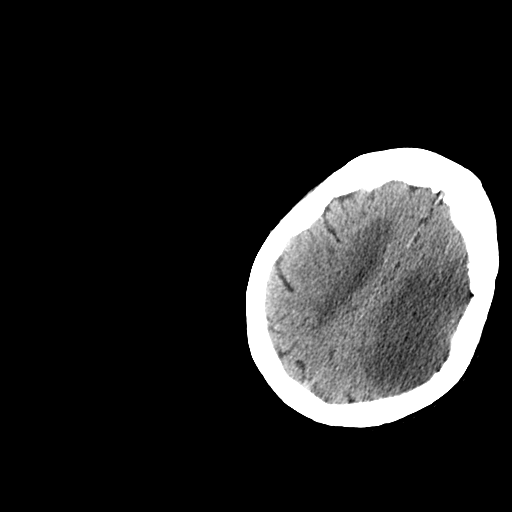
[im 21/34  brain]
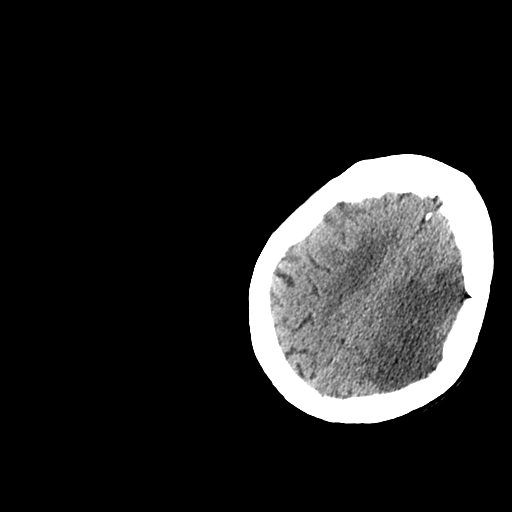
[im 23/34  brain]
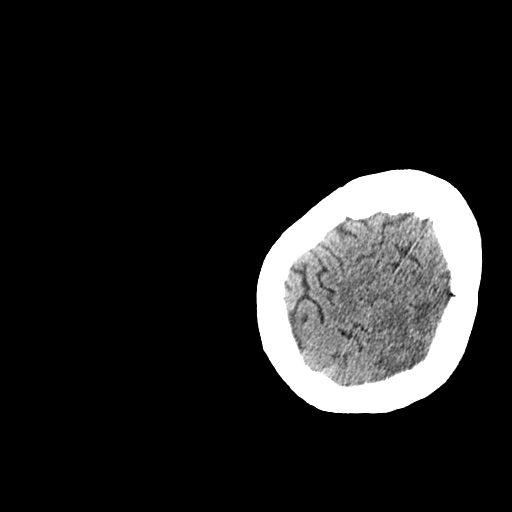
[im 27/34  brain]
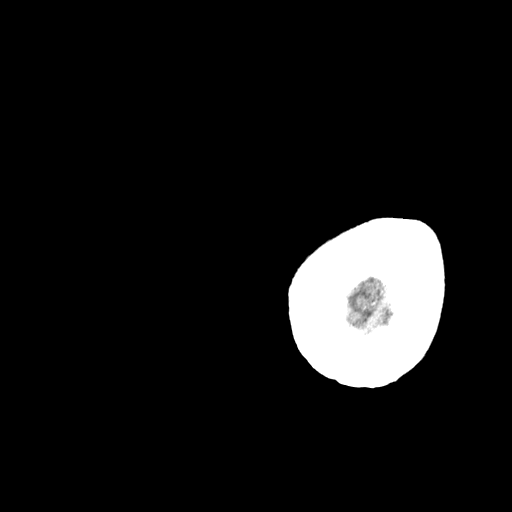
[im 27/34  bone]
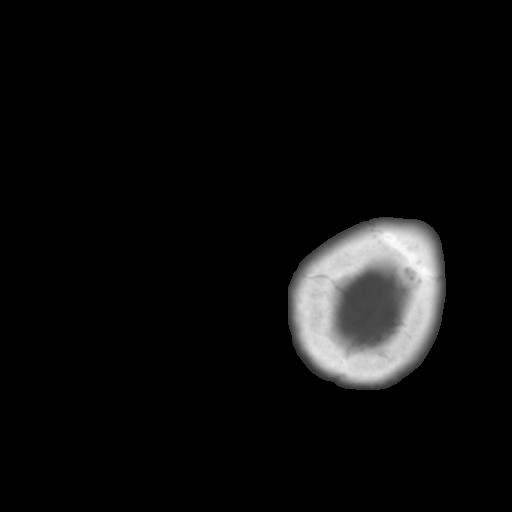
[im 28/34  brain]
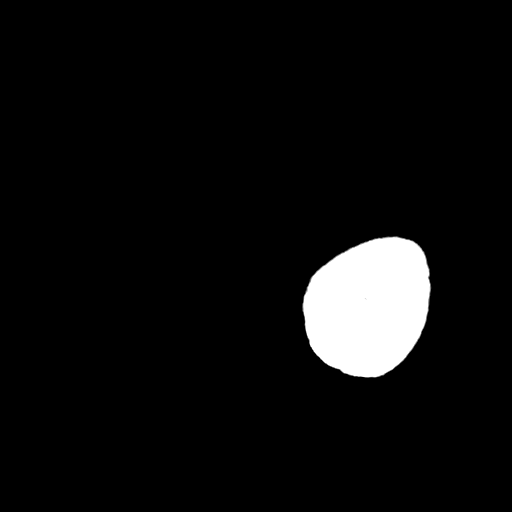
[im 30/34  brain]
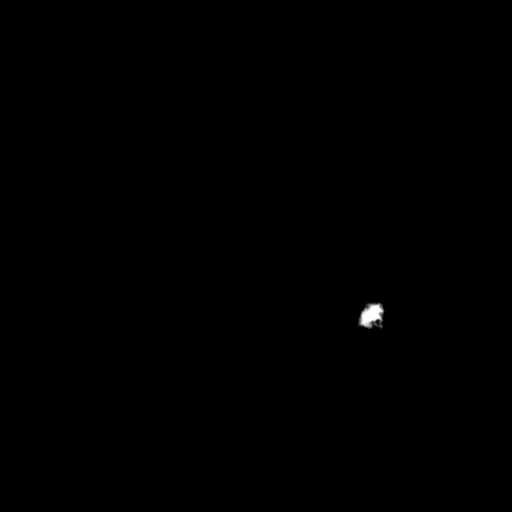
[im 32/34  brain]
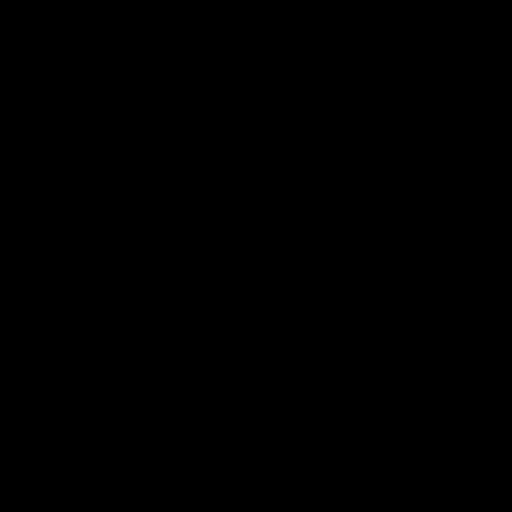

[16 of 30 positions shown; findings below may reference images not displayed]

FINDINGS: Abnormal hypodensity and loss of gray-white differentiation in the
left MCA distribution is compatible with a large left MCA
distribution infarct. No hemorrhagic transformation observed.

Periventricular white matter and corona radiata hypodensities favor
chronic ischemic microvascular white matter disease.

No midline shift. No ventricular effacement. No significant
extra-axial fluid collection. No mass lesion noted.
IMPRESSION: 1. Large region of hypodensity in the left MCA distribution
compatible with MCA infarct. No hemorrhagic transformation observed.

## 2015-06-20 IMAGING — CR DG ABD PORTABLE 1V
1 series · 1 of 1 positions shown · non-contrast
Comparison: None.

CLINICAL DATA: Small bowel obstruction. Evaluate for interval
change.

EXAM:
PORTABLE ABDOMEN - 1 VIEW

[supine ap]
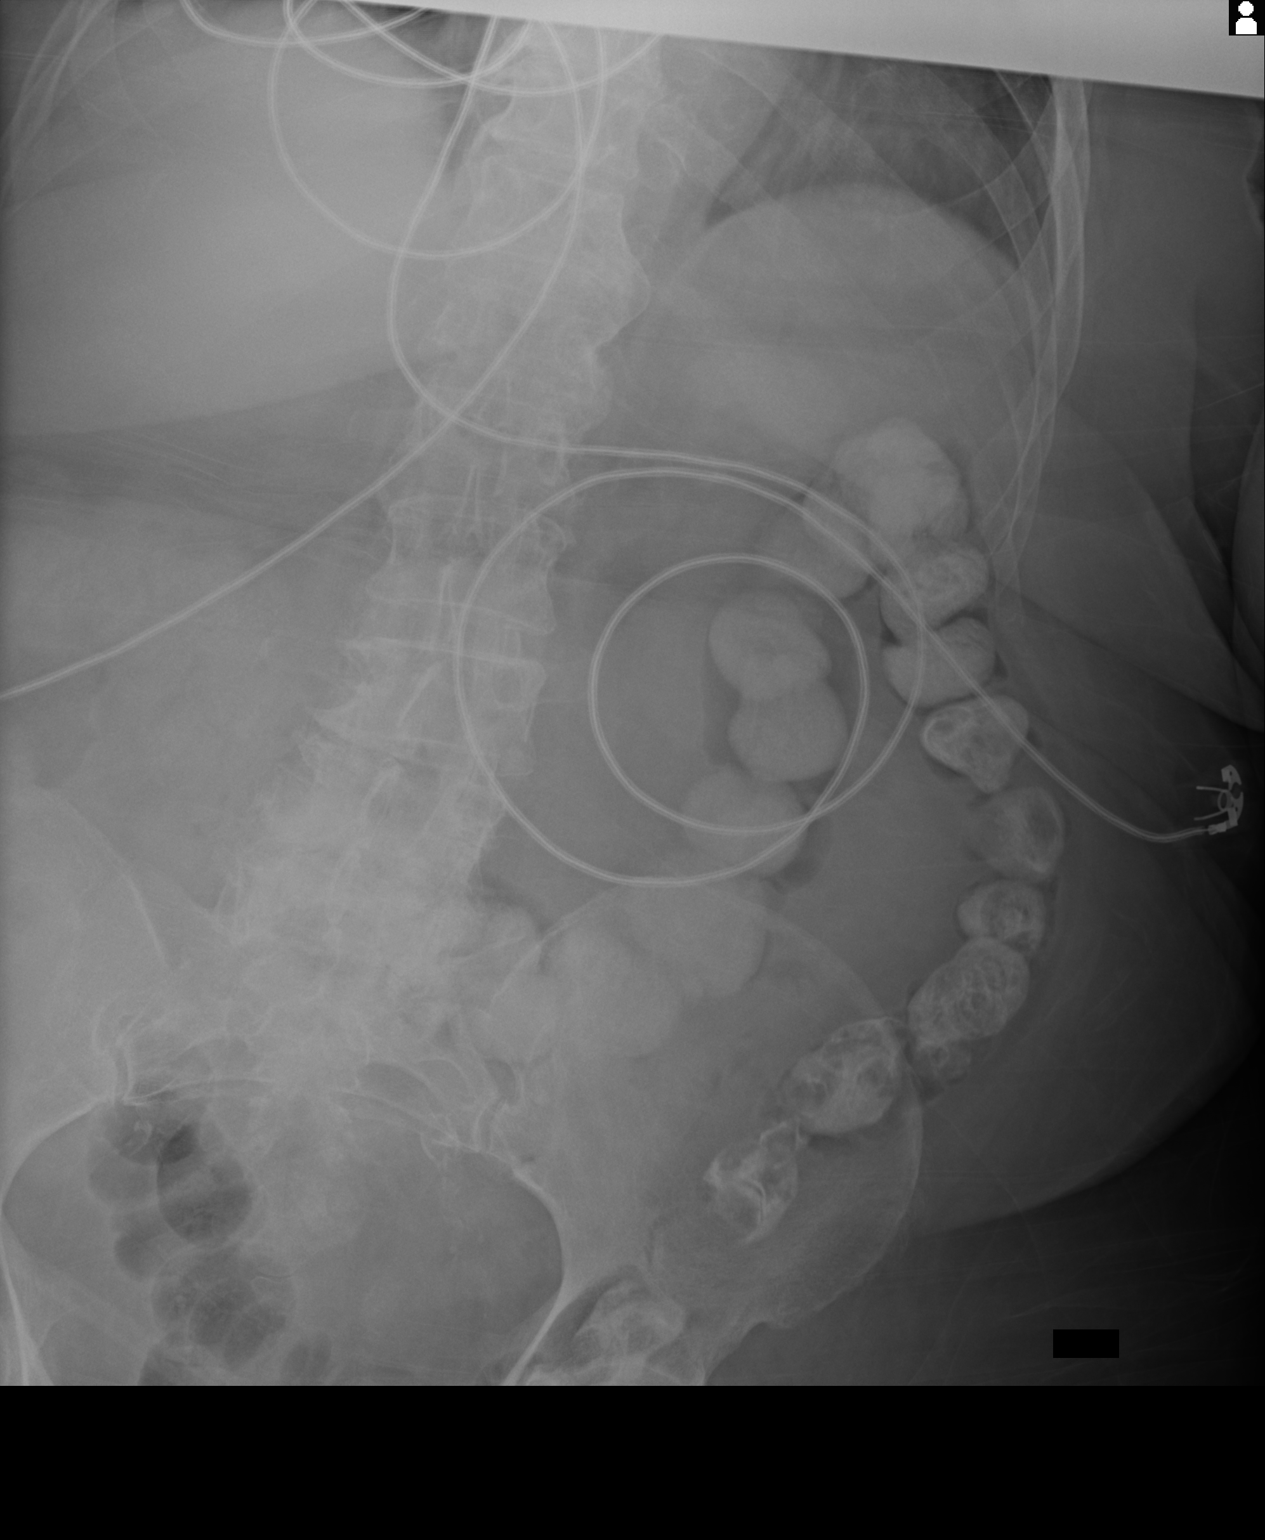

[1 of 1 positions shown; findings below may reference images not displayed]

FINDINGS: Single portable supine view of the abdomen and pelvis. The far
right-sided abdomen is excluded. Contrast is seen within normal
caliber colon. Relative paucity of small bowel gas, nonspecific.
Right hemidiaphragm elevation. No gross free intraperitoneal air or
abnormal abdominal calcifications.
IMPRESSION: Paucity of small bowel gas, nonspecific. No specific evidence of
bowel obstruction or free intraperitoneal air.

Exclusion of the for right-sided abdomen.
